# Patient Record
Sex: Male | Born: 1965 | Race: White | Hispanic: No | Smoking: Heavy tobacco smoker
Health system: Southern US, Community
[De-identification: ages and names within clinical notes are randomized; demographics above are authoritative.]

## PROBLEM LIST (undated history)

## (undated) DIAGNOSIS — K219 Gastro-esophageal reflux disease without esophagitis: Secondary | ICD-10-CM

## (undated) DIAGNOSIS — Z87828 Personal history of other (healed) physical injury and trauma: Secondary | ICD-10-CM

## (undated) DIAGNOSIS — I209 Angina pectoris, unspecified: Secondary | ICD-10-CM

## (undated) DIAGNOSIS — M5090 Cervical disc disorder, unspecified, unspecified cervical region: Secondary | ICD-10-CM

## (undated) HISTORY — PX: APPENDECTOMY: SHX54

---

## 2005-03-02 ENCOUNTER — Emergency Department: Payer: Self-pay | Admitting: Internal Medicine

## 2007-03-27 ENCOUNTER — Emergency Department: Payer: Self-pay | Admitting: Emergency Medicine

## 2007-03-29 ENCOUNTER — Emergency Department: Payer: Self-pay | Admitting: Emergency Medicine

## 2008-04-06 DIAGNOSIS — M25551 Pain in right hip: Secondary | ICD-10-CM | POA: Insufficient documentation

## 2008-05-19 DIAGNOSIS — M51369 Other intervertebral disc degeneration, lumbar region without mention of lumbar back pain or lower extremity pain: Secondary | ICD-10-CM | POA: Insufficient documentation

## 2008-05-19 DIAGNOSIS — M47819 Spondylosis without myelopathy or radiculopathy, site unspecified: Secondary | ICD-10-CM | POA: Insufficient documentation

## 2008-09-30 DIAGNOSIS — J322 Chronic ethmoidal sinusitis: Secondary | ICD-10-CM | POA: Insufficient documentation

## 2009-05-11 DIAGNOSIS — G894 Chronic pain syndrome: Secondary | ICD-10-CM | POA: Insufficient documentation

## 2011-10-23 ENCOUNTER — Emergency Department: Payer: Self-pay | Admitting: Emergency Medicine

## 2014-04-13 ENCOUNTER — Ambulatory Visit: Payer: Self-pay | Admitting: Pain Medicine

## 2014-04-27 ENCOUNTER — Ambulatory Visit: Payer: Self-pay | Admitting: Pain Medicine

## 2014-05-31 ENCOUNTER — Ambulatory Visit: Payer: Self-pay | Admitting: Pain Medicine

## 2014-06-06 ENCOUNTER — Ambulatory Visit: Payer: Self-pay | Admitting: Pain Medicine

## 2014-06-13 ENCOUNTER — Ambulatory Visit: Payer: Self-pay | Admitting: Pain Medicine

## 2014-06-14 ENCOUNTER — Emergency Department: Payer: Self-pay | Admitting: Emergency Medicine

## 2014-06-22 ENCOUNTER — Ambulatory Visit: Payer: Self-pay | Admitting: Pain Medicine

## 2014-07-19 ENCOUNTER — Ambulatory Visit: Payer: Self-pay | Admitting: Pain Medicine

## 2014-08-01 ENCOUNTER — Ambulatory Visit: Payer: Self-pay | Admitting: Pain Medicine

## 2014-08-18 ENCOUNTER — Ambulatory Visit: Payer: Self-pay | Admitting: Pain Medicine

## 2015-07-06 DIAGNOSIS — E7800 Pure hypercholesterolemia, unspecified: Secondary | ICD-10-CM | POA: Insufficient documentation

## 2016-01-22 ENCOUNTER — Emergency Department: Payer: Medicare Other

## 2016-01-22 ENCOUNTER — Emergency Department
Admission: EM | Admit: 2016-01-22 | Discharge: 2016-01-22 | Disposition: A | Discharge status: Home / Self Care | Payer: Medicare Other | Attending: Emergency Medicine | Admitting: Emergency Medicine

## 2016-01-22 ENCOUNTER — Encounter: Payer: Self-pay | Admitting: Emergency Medicine

## 2016-01-22 DIAGNOSIS — W25XXXA Contact with sharp glass, initial encounter: Secondary | ICD-10-CM | POA: Diagnosis not present

## 2016-01-22 DIAGNOSIS — S91341A Puncture wound with foreign body, right foot, initial encounter: Secondary | ICD-10-CM | POA: Insufficient documentation

## 2016-01-22 DIAGNOSIS — Y9389 Activity, other specified: Secondary | ICD-10-CM | POA: Insufficient documentation

## 2016-01-22 DIAGNOSIS — Y990 Civilian activity done for income or pay: Secondary | ICD-10-CM | POA: Diagnosis not present

## 2016-01-22 DIAGNOSIS — F17200 Nicotine dependence, unspecified, uncomplicated: Secondary | ICD-10-CM | POA: Insufficient documentation

## 2016-01-22 DIAGNOSIS — Y9289 Other specified places as the place of occurrence of the external cause: Secondary | ICD-10-CM | POA: Diagnosis not present

## 2016-01-22 DIAGNOSIS — S99921A Unspecified injury of right foot, initial encounter: Secondary | ICD-10-CM | POA: Diagnosis present

## 2016-01-22 DIAGNOSIS — S91331A Puncture wound without foreign body, right foot, initial encounter: Secondary | ICD-10-CM

## 2016-01-22 MED ORDER — LEVOFLOXACIN 500 MG PO TABS
750.0000 mg | ORAL_TABLET | Freq: Once | ORAL | Status: AC
  Administered 2016-01-22: 750 mg via ORAL
  Filled 2016-01-22: qty 1

## 2016-01-22 MED ORDER — LEVOFLOXACIN 750 MG PO TABS: 750.0000 mg | ORAL_TABLET | Freq: Every day | ORAL | Status: AC

## 2016-01-22 MED ORDER — HYDROCODONE-ACETAMINOPHEN 5-325 MG PO TABS: 1.0000 | ORAL_TABLET | Freq: Four times a day (QID) | ORAL | Status: DC | PRN

## 2016-01-22 MED ORDER — OXYCODONE-ACETAMINOPHEN 5-325 MG PO TABS
ORAL_TABLET | ORAL | Status: AC
  Administered 2016-01-22: 1 via ORAL
  Filled 2016-01-22: qty 1

## 2016-01-22 MED ORDER — OXYCODONE-ACETAMINOPHEN 5-325 MG PO TABS
1.0000 | ORAL_TABLET | Freq: Once | ORAL | Status: AC
  Administered 2016-01-22: 1 via ORAL

## 2016-01-22 MED ORDER — AMOXICILLIN-POT CLAVULANATE 875-125 MG PO TABS
1.0000 | ORAL_TABLET | Freq: Once | ORAL | Status: AC
  Administered 2016-01-22: 1 via ORAL

## 2016-01-22 MED ORDER — BACITRACIN ZINC 500 UNIT/GM EX OINT
TOPICAL_OINTMENT | Freq: Two times a day (BID) | CUTANEOUS | Status: DC
  Administered 2016-01-22: 1 via TOPICAL
  Filled 2016-01-22: qty 0.9

## 2016-01-22 MED ORDER — AMOXICILLIN-POT CLAVULANATE 875-125 MG PO TABS
ORAL_TABLET | ORAL | Status: AC
  Administered 2016-01-22: 1 via ORAL
  Filled 2016-01-22: qty 1

## 2016-01-22 MED ORDER — LIDOCAINE HCL (PF) 1 % IJ SOLN
5.0000 mL | Freq: Once | INTRAMUSCULAR | Status: AC
  Administered 2016-01-22: 5 mL
  Filled 2016-01-22: qty 5

## 2016-01-22 NOTE — ED Notes (Signed)
Pt reports he stepped on a broken beer bottle and a piece of glass went through his shoe and into the left foot. On assessment pt has shoe on, glass seen sticking out of the bottom of shoe with blood present from impalement.

## 2016-01-22 NOTE — ED Notes (Signed)
Pt arrived to the ED for laceration to the right foot with a pice of glass. Pt is AOx4 in no apparent distress with a visible piece of glass impaled through the shoe, no visible bleeding at the time of triage.

## 2016-01-22 NOTE — Discharge Instructions (Signed)
Puncture Wound A puncture wound is an injury that is caused by a sharp, thin object that goes through (penetrates) your skin. Usually, a puncture wound does not leave a large opening in your skin, so it may not bleed a lot. However, when you get a puncture wound, dirt or other materials (foreign bodies) can be forced into your wound and break off inside. This increases the chance of infection, such as tetanus. CAUSES Puncture wounds are caused by any sharp, thin object that goes through your skin, such as:  Animal teeth, as with an animal bite.  Sharp, pointed objects, such as nails, splinters of glass, fishhooks, and needles. SYMPTOMS Symptoms of a puncture wound include:  Pain.  Bleeding.  Swelling.  Bruising.  Fluid leaking from the wound.  Numbness, tingling, or loss of function. DIAGNOSIS This condition is diagnosed with a medical history and physical exam. Your wound will be checked to see if it contains any foreign bodies. You may also have X-rays or other imaging tests. TREATMENT Treatment for a puncture wound depends on how serious the wound is. It also depends on whether the wound contains any foreign bodies. Treatment for all types of puncture wounds usually starts with:  Controlling the bleeding.  Washing out the wound with a germ-free (sterile) salt-water solution.  Checking the wound for foreign bodies. Treatment may also include:  Having the wound opened surgically to remove a foreign object.  Closing the wound with stitches (sutures) if it continues to bleed.  Covering the wound with antibiotic ointments and a bandage (dressing).  Receiving a tetanus shot.  Receiving a rabies vaccine. HOME CARE INSTRUCTIONS Medicines  Take or apply over-the-counter and prescription medicines only as told by your health care provider.  If you were prescribed an antibiotic, take or apply it as told by your health care provider. Do not stop using the antibiotic even if  your condition improves. Wound Care  There are many ways to close and cover a wound. For example, a wound can be covered with sutures, skin glue, or adhesive strips. Follow instructions from your health care provider about:  How to take care of your wound.  When and how you should change your dressing.  When you should remove your dressing.  Removing whatever was used to close your wound.  Keep the dressing dry as told by your health care provider. Do not take baths, swim, use a hot tub, or do anything that would put your wound underwater until your health care provider approves.  Clean the wound as told by your health care provider.  Do not scratch or pick at the wound.  Check your wound every day for signs of infection. Watch for:  Redness, swelling, or pain.  Fluid, blood, or pus. General Instructions  Raise (elevate) the injured area above the level of your heart while you are sitting or lying down.  If your puncture wound is in your foot, ask your health care provider if you need to avoid putting weight on your foot and for how long.  Keep all follow-up visits as told by your health care provider. This is important. SEEK MEDICAL CARE IF:  You received a tetanus shot and you have swelling, severe pain, redness, or bleeding at the injection site.  You have a fever.  Your sutures come out.  You notice a bad smell coming from your wound or your dressing.  You notice something coming out of your wound, such as wood or glass.  Your   pain is not controlled with medicine.  You have increased redness, swelling, or pain at the site of your wound.  You have fluid, blood, or pus coming from your wound.  You notice a change in the color of your skin near your wound.  You need to change the dressing frequently due to fluid, blood, or pus draining from your wound.  You develop a new rash.  You develop numbness around your wound. SEEK IMMEDIATE MEDICAL CARE IF:  You  develop severe swelling around your wound.  Your pain suddenly increases and is severe.  You develop painful skin lumps.  You have a red streak going away from your wound.  The wound is on your hand or foot and you cannot properly move a finger or toe.  The wound is on your hand or foot and you notice that your fingers or toes look pale or bluish.   This information is not intended to replace advice given to you by your health care provider. Make sure you discuss any questions you have with your health care provider.   Document Released: 05/01/2005 Document Revised: 04/12/2015 Document Reviewed: 09/14/2014 Elsevier Interactive Patient Education [employer]2016 [employer]   Keep the wound clean, dry, and covered. Rest with the foot elevated when seated. Take the antibiotic as directed until completely gone. Return to the ED immediately for signs of worsening or spreading infection.

## 2016-01-23 NOTE — ED Provider Notes (Signed)
[facility] Emergency Department Provider Note ____________________________________________  Time seen: 2121  I have reviewed the triage vital signs and the nursing notes.  HISTORY  Chief Complaint  Foot Injury  HPI Andrew Rios is a 50 y.o. male presents to the ED for evaluation of a piece of glass that he stepped on about 20 minutes prior to arrival. 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 The glass pushed through the bottom of his shoe and into the bottom of his right foot. He presents with a current tetanus status and denies any head injury at this time. He's been unable to remove nor has he tried to remove the shoe prior to arrival.  History reviewed. No pertinent past medical history.  There are no active problems to display for this patient.   Past Surgical History  Procedure Laterality Date  . Appendectomy      Current Outpatient Rx  Name  Route  Sig  Dispense  Refill  . HYDROcodone-acetaminophen (NORCO) 5-325 MG tablet   Oral   Take 1 tablet by mouth every 6 (six) hours as needed for moderate pain.   12 tablet   0   . levofloxacin (LEVAQUIN) 750 MG tablet   Oral   Take 1 tablet (750 mg total) by mouth daily.   14 tablet   0    Allergies Review of patient's allergies indicates no known allergies.  History reviewed. No pertinent family history.  Social History Social History  Substance Use Topics  . Smoking status: Heavy Tobacco Smoker  . Smokeless tobacco: None  . Alcohol Use: No   Review of Systems  Constitutional: Negative for fever. Respiratory: Negative for shortness of breath. Musculoskeletal: Negative for back pain. Right foot retained foreign body. Skin: Negative for rash. Neurological: Negative for headaches, focal weakness or numbness. ____________________________________________  PHYSICAL EXAM:  VITAL SIGNS: ED  Triage Vitals  Enc Vitals Group     BP 01/22/16 2100 132/91 mmHg     Pulse Rate 01/22/16 2100 84     Resp 01/22/16 2100 18     Temp 01/22/16 2100 98.1 F (36.7 C)     Temp Source 01/22/16 2100 Oral     SpO2 01/22/16 2100 96 %     Weight 01/22/16 2100 165 lb (74.844 kg)     Height 01/22/16 2100 5\' 9"  (1.753 m)     Head Cir --      Peak Flow --      Pain Score 01/22/16 2102 5     Pain Loc --      Pain Edu? --      Excl. in GC? --    Constitutional: Alert and oriented. Well appearing and in no distress. Head: Normocephalic and atraumatic. Cardiovascular: Normal rate, regular rhythm.  Respiratory: Normal respiratory effort. No wheezes/rales/rhonchi. Gastrointestinal: Soft and nontender. No distention. Musculoskeletal: Patient with normal range of motion of the toes on the right foot following exposure of the injury. Nontender with normal range of motion in all extremities.  Neurologic:  Normal gait without ataxia. Normal speech and language. No gross focal neurologic deficits are appreciated. Skin:  Patient with a large: Glass foreign body impaled through the rubber soled shoe and into the plantar surface of his right foot. The shoe is in place and attempts to manipulate the foreign body caused discomfort. The patient's leather loafer is cut across  the dorsal surface allowing the shoe to be removed without difficulty. The large glass foreign body remains impaled through the shoe. No evidence of retained foreign body is appreciated grossly. A 1-1/2 cm linear laceration just below the third MTP remains. Further exploration is warranted. _______________________________   RADIOLOGY Right Foot IMPRESSION: 1. Curvilinear foreign body in the plantar soft tissues between second and third toes. 2. No acute bone abnormality is evident.  I, Jadira Nierman, Charlesetta Ivory, personally viewed and evaluated these images (plain radiographs) as part of my medical decision making, as well as reviewing the  written report by the radiologist. ____________________________________________  PROCEDURES  Augmentin 875 mg PO Percocet 5-325 mg PO Levaquin 750 mg PO  PUNCTURE WOUND EXPLORATION Performed by: Lissa Hoard Consent: Verbal consent obtained. Risks and benefits: risks, benefits and alternatives were discussed Type: abscess  Body area: Plantar surface right foot  Anesthesia: local infiltration  Local anesthetic: lidocaine 1% w/o epinephrine  Anesthetic total: 4 ml  Complexity: complex Explored using sterile forceps   Foreign Body(ies) Identified/Removed: none  Wound Cleansing: iodine + saline  Patient tolerance: Patient tolerated the procedure well with no immediate complications. ____________________________________________  INITIAL IMPRESSION / ASSESSMENT AND PLAN / ED COURSE  Patient with a puncture wound to the right plantar foot status post removal. The wound is anesthetized and explored and no additional foreign bodies are revealed. The patient is treated empirically with Levaquin and Augmentin in the ED. He'll be discharged with a prescription for Levaquin stenosis as directed. He is advised to monitor the foot wound closely for signs of an infection. He should rest the foot elevated and return to the ED immediately for any signs of infection.He is advised to return to ED in 3 days for wound check. ____________________________________________  FINAL CLINICAL IMPRESSION(S) / ED DIAGNOSES  Final diagnoses:  Puncture wound of foot, right, initial encounter     Lissa Hoard, PA-C 01/23/16 0136  Loleta Rose, MD 01/23/16 4782

## 2016-06-04 ENCOUNTER — Ambulatory Visit
Admission: EM | Admit: 2016-06-04 | Discharge: 2016-06-04 | Disposition: A | Discharge status: Home / Self Care | Payer: Medicare Other | Attending: Family Medicine | Admitting: Family Medicine

## 2016-06-04 DIAGNOSIS — J019 Acute sinusitis, unspecified: Secondary | ICD-10-CM | POA: Diagnosis not present

## 2016-06-04 DIAGNOSIS — J309 Allergic rhinitis, unspecified: Secondary | ICD-10-CM | POA: Diagnosis not present

## 2016-06-04 DIAGNOSIS — H1013 Acute atopic conjunctivitis, bilateral: Secondary | ICD-10-CM | POA: Diagnosis not present

## 2016-06-04 HISTORY — DX: Personal history of other (healed) physical injury and trauma: Z87.828

## 2016-06-04 MED ORDER — KETOTIFEN FUMARATE 0.025 % OP SOLN: 1.0000 [drp] | Freq: Two times a day (BID) | OPHTHALMIC | 0 refills | Status: DC

## 2016-06-04 MED ORDER — KETOROLAC TROMETHAMINE 0.5 % OP SOLN: 1.0000 [drp] | Freq: Four times a day (QID) | OPHTHALMIC | 0 refills | Status: DC

## 2016-06-04 MED ORDER — AMOXICILLIN-POT CLAVULANATE 875-125 MG PO TABS: 1.0000 | ORAL_TABLET | Freq: Two times a day (BID) | ORAL | 0 refills | Status: DC

## 2016-06-04 MED ORDER — CETIRIZINE-PSEUDOEPHEDRINE ER 5-120 MG PO TB12: 1.0000 | ORAL_TABLET | Freq: Two times a day (BID) | ORAL | 0 refills | Status: DC

## 2016-06-04 MED ORDER — FLUTICASONE PROPIONATE 50 MCG/ACT NA SUSP: 1.0000 | Freq: Two times a day (BID) | NASAL | 0 refills | Status: DC

## 2016-06-04 NOTE — ED Triage Notes (Signed)
Pt is suffering from sinus infection, and has a lot of pressure and coughing for about 2 weeks.

## 2016-06-04 NOTE — ED Provider Notes (Signed)
CSN: [id number]     Arrival date & time 06/04/16  1416 History   First MD Initiated Contact with Patient 06/04/16 1536     Chief Complaint  Patient presents with  . Recurrent Sinusitis   (Consider location/radiation/quality/duration/timing/severity/associated sxs/prior Treatment) HPI Patient is a 50 y.o. Male presents to UC c/o sinus pressure, cough, and bilateral eye itching and swelling, worse in left eye.  Has had sinus infection x 3 weeks with yellow drainage, sinus pressure, and cough. Cough mildly productive of yellow sputum. He has been taking Nyquil and says that the symptoms started to improve for a couple days but seem to have come back although his cough is improved. Reports having pneumonia 4x in the last 5 years. States that it is usually when things start to clear up in his head that he gets diagnosed with pneumonia.  He also reports waking up on Sunday with his left eyelid glued shut. He states that both eyes are itchy and watery although left feels more swollen. Denies sick contacts. Smokes 1/2 ppd x 35 years. Denies chills, fever, abdominal pain, N/V/D.  Past Medical History:  Diagnosis Date  . History of back injury    Past Surgical History:  Procedure Laterality Date  . APPENDECTOMY     Family History  Problem Relation Age of Onset  . Hypertension Mother   . Diabetes Father    Social History  Substance Use Topics  . Smoking status: Heavy Tobacco Smoker  . Smokeless tobacco: Never Used  . Alcohol use No    Review of Systems  Constitutional: Negative for chills, fatigue and fever.  HENT: Positive for congestion and ear discharge. Negative for ear pain, postnasal drip and sore throat.   Eyes: Positive for discharge (Clear, both eyes), itching (bilateral) and visual disturbance (Mild blurriness in left eye). Negative for photophobia.  Respiratory: Positive for cough (Mild, improving). Negative for chest tightness, shortness of breath and wheezing.   Cardiovascular:  Negative for chest pain.  Gastrointestinal: Positive for diarrhea (Intermittent last 3 weeks). Negative for abdominal pain, constipation, nausea and vomiting.  Neurological: Negative for dizziness and headaches.    Allergies  Review of patient's allergies indicates no known allergies.  Home Medications   Prior to Admission medications   Medication Sig Start Date End Date Taking? Authorizing Provider  HYDROcodone-acetaminophen (NORCO) 5-325 MG tablet Take 1 tablet by mouth every 6 (six) hours as needed for moderate pain. 01/22/16  Yes Jenise V Bacon Menshew, PA-C  amoxicillin-clavulanate (AUGMENTIN) 875-125 MG tablet Take 1 tablet by mouth every 12 (twelve) hours. 06/04/16   Lutricia FeilWilliam P Roemer, PA-C  cetirizine-pseudoephedrine (ZYRTEC-D) 5-120 MG tablet Take 1 tablet by mouth 2 (two) times daily. 06/04/16   Lutricia FeilWilliam P Roemer, PA-C  fluticasone (FLONASE) 50 MCG/ACT nasal spray Place 1 spray into both nostrils 2 (two) times daily. 06/04/16   Lutricia FeilWilliam P Roemer, PA-C  ketorolac (ACULAR) 0.5 % ophthalmic solution Place 1 drop into both eyes 4 (four) times daily. 06/04/16   Lutricia FeilWilliam P Roemer, PA-C  ketotifen (ALAWAY) 0.025 % ophthalmic solution Place 1 drop into both eyes 2 (two) times daily. 06/04/16   Lutricia FeilWilliam P Roemer, PA-C   Meds Ordered and Administered this Visit  Medications - No data to display  BP 113/88 (BP Location: Left Arm)   Pulse 79   Temp 98.6 F (37 C) (Oral)   Resp 18   Ht 5\' 7"  (1.702 m)   Wt 155 lb (70.3 kg)   SpO2 98%   BMI  24.28 kg/m  No data found.   Physical Exam  Constitutional: He is oriented to person, place, and time. He appears well-developed and well-nourished.  HENT:  Head: Normocephalic.  Right Ear: Hearing, tympanic membrane, external ear and ear canal normal.  Left Ear: Hearing, tympanic membrane, external ear and ear canal normal.  Nose: Right sinus exhibits no maxillary sinus tenderness and no frontal sinus tenderness. Left sinus exhibits no maxillary  sinus tenderness and no frontal sinus tenderness.  Mouth/Throat: Uvula is midline, oropharynx is clear and moist and mucous membranes are normal. Tonsils are 1+ on the right. Tonsils are 1+ on the left. No tonsillar exudate.  Eyes: EOM are normal. Pupils are equal, round, and reactive to light. Lids are everted and swept, no foreign bodies found. Right eye exhibits discharge (Clear discharge). Left eye exhibits discharge (clear, with swelling of upper lid ). Right conjunctiva is injected (mild). Left conjunctiva is injected (mild).  Neck: Normal range of motion. Neck supple.  Cardiovascular: Normal rate and regular rhythm.   Pulmonary/Chest: Effort normal and breath sounds normal. No respiratory distress.  Neurological: He is alert and oriented to person, place, and time.  Skin: Skin is warm and dry.  Psychiatric: He has a normal mood and affect. His behavior is normal.  Nursing note and vitals reviewed.   Urgent Care Course   Clinical Course    Procedures (including critical care time)  Labs Review Labs Reviewed - No data to display  Imaging Review No results found.   Visual Acuity Review  Right Eye Distance: 20/40 Left Eye Distance: 20/50 Bilateral Distance: 20/40   MDM   1. Acute non-recurrent sinusitis, unspecified location   2. Allergic conjunctivitis of both eyes and rhinitis    Discharge Medication List as of 06/04/2016  4:12 PM    START taking these medications   Details  amoxicillin-clavulanate (AUGMENTIN) 875-125 MG tablet Take 1 tablet by mouth every 12 (twelve) hours., Starting Tue 06/04/2016, Normal    cetirizine-pseudoephedrine (ZYRTEC-D) 5-120 MG tablet Take 1 tablet by mouth 2 (two) times daily., Starting Tue 06/04/2016, Normal    fluticasone (FLONASE) 50 MCG/ACT nasal spray Place 1 spray into both nostrils 2 (two) times daily., Starting Tue 06/04/2016, Normal    ketorolac (ACULAR) 0.5 % ophthalmic solution Place 1 drop into both eyes 4 (four) times  daily., Starting Tue 06/04/2016, Normal    ketotifen (ALAWAY) 0.025 % ophthalmic solution Place 1 drop into both eyes 2 (two) times daily., Starting Tue 06/04/2016, Normal        Plan: 1. Test/x-ray results and diagnosis reviewed with patient 2. rx as per orders; risks, benefits, potential side effects reviewed with patient.  Advised to switch to Zyrtec without the pseudoephedrine in a couple of weeks. 3. Recommend supportive treatment with plenty of fluids, tea with honey and lemon, cold compresses on the eyes, rest. 4. F/u prn if symptoms worsen or don't improve     Lutricia FeilWilliam P Roemer, PA-C 06/04/16 1659

## 2017-04-23 DIAGNOSIS — E786 Lipoprotein deficiency: Secondary | ICD-10-CM | POA: Insufficient documentation

## 2017-06-04 ENCOUNTER — Emergency Department: Payer: Medicare Other

## 2017-06-04 ENCOUNTER — Emergency Department
Admission: EM | Admit: 2017-06-04 | Discharge: 2017-06-04 | Disposition: A | Discharge status: Home / Self Care | Payer: Medicare Other | Attending: Emergency Medicine | Admitting: Emergency Medicine

## 2017-06-04 ENCOUNTER — Encounter: Payer: Self-pay | Admitting: Emergency Medicine

## 2017-06-04 DIAGNOSIS — F17200 Nicotine dependence, unspecified, uncomplicated: Secondary | ICD-10-CM | POA: Insufficient documentation

## 2017-06-04 DIAGNOSIS — M25572 Pain in left ankle and joints of left foot: Secondary | ICD-10-CM | POA: Diagnosis not present

## 2017-06-04 DIAGNOSIS — Z79899 Other long term (current) drug therapy: Secondary | ICD-10-CM | POA: Diagnosis not present

## 2017-06-04 DIAGNOSIS — M79605 Pain in left leg: Secondary | ICD-10-CM | POA: Diagnosis present

## 2017-06-04 LAB — COMPREHENSIVE METABOLIC PANEL
ALBUMIN: 4 g/dL (ref 3.5–5.0)
ALT: 19 U/L (ref 17–63)
ANION GAP: 10 (ref 5–15)
AST: 21 U/L (ref 15–41)
Alkaline Phosphatase: 92 U/L (ref 38–126)
BILIRUBIN TOTAL: 0.6 mg/dL (ref 0.3–1.2)
BUN: 7 mg/dL (ref 6–20)
CO2: 28 mmol/L (ref 22–32)
Calcium: 9.1 mg/dL (ref 8.9–10.3)
Chloride: 101 mmol/L (ref 101–111)
Creatinine, Ser: 1.02 mg/dL (ref 0.61–1.24)
GFR calc Af Amer: >60 mL/min (ref >60)
GFR calc non Af Amer: >60 mL/min (ref >60)
GLUCOSE: 110 mg/dL — AB (ref 65–99)
POTASSIUM: 4.6 mmol/L (ref 3.5–5.1)
SODIUM: 139 mmol/L (ref 135–145)
TOTAL PROTEIN: 7 g/dL (ref 6.5–8.1)

## 2017-06-04 LAB — CBC
HCT: 40.8 % (ref 40.0–52.0)
Hemoglobin: 14.2 g/dL (ref 13.0–18.0)
MCH: 34.4 pg — ABNORMAL HIGH (ref 26.0–34.0)
MCHC: 34.8 g/dL (ref 32.0–36.0)
MCV: 99 fL (ref 80.0–100.0)
Platelets: 259 10*3/uL (ref 150–440)
RBC: 4.12 MIL/uL — ABNORMAL LOW (ref 4.40–5.90)
RDW: 14.5 % (ref 11.5–14.5)
WBC: 9.4 10*3/uL (ref 3.8–10.6)

## 2017-06-04 MED ORDER — HYDROCODONE-ACETAMINOPHEN 5-325 MG PO TABS: 1.0000 | ORAL_TABLET | ORAL | 0 refills | Status: DC | PRN

## 2017-06-04 MED ORDER — INDOMETHACIN 50 MG PO CAPS: 50.0000 mg | ORAL_CAPSULE | Freq: Two times a day (BID) | ORAL | 0 refills | Status: DC

## 2017-06-04 MED ORDER — HYDROCODONE-ACETAMINOPHEN 5-325 MG PO TABS
2.0000 | ORAL_TABLET | Freq: Once | ORAL | Status: AC
  Administered 2017-06-04: 2 via ORAL
  Filled 2017-06-04: qty 2

## 2017-06-04 NOTE — ED Notes (Signed)
Spoke with Dr. Williams in regards to patient presentation. See orders. 

## 2017-06-04 NOTE — ED Notes (Addendum)
Pt stating that he has been having problems with his left foot since last Thursday night. Pt stating that he does not recall any injuries done. Pt stating decreased sensitivity. Pt stating pain increases with walking. Pt stating pain in primarily in left medial ankle. Minimal edema noted. Pt stating that pain has actually decreased since Monday, but that it is still present.

## 2017-06-04 NOTE — ED Triage Notes (Signed)
Patient presents to ED via POV from home with c/o left ankle pain that radiates up to the back of his calf. Calf is red and edematous. Patient denies SOB or CP. Denies any injury or trauma. Ankle is edematous as well.

## 2017-06-04 NOTE — ED Notes (Signed)
Pt's father was brought from the waiting room.

## 2017-06-04 NOTE — ED Provider Notes (Signed)
[facility] Emergency Department Provider Note  Time seen: 5:00 PM  I have reviewed the triage vital signs and the nursing notes.   HISTORY  Chief Complaint Leg Pain    HPI Andrew Rios is a 51 y.o. male who presents to the emergency department with left ankle pain and swelling.  According to the patient approximately 1 week ago he awoke with left ankle pain and swelling.  He states he does not specifically recall an injury but states it is possible that he injured the ankle.  Denies any fever, chest pain, shortness of breath.  Patient states significant pain with attempted ambulation or any weightbearing of the left ankle.   Past Medical History:  Diagnosis Date  . History of back injury     There are no active problems to display for this patient.   Past Surgical History:  Procedure Laterality Date  . APPENDECTOMY      Prior to Admission medications   Medication Sig Start Date End Date Taking? Authorizing Provider  amoxicillin-clavulanate (AUGMENTIN) 875-125 MG tablet Take 1 tablet by mouth every 12 (twelve) hours. 06/04/16   Lutricia Feil, PA-C  cetirizine-pseudoephedrine (ZYRTEC-D) 5-120 MG tablet Take 1 tablet by mouth 2 (two) times daily. 06/04/16   Lutricia Feil, PA-C  fluticasone (FLONASE) 50 MCG/ACT nasal spray Place 1 spray into both nostrils 2 (two) times daily. 06/04/16   Lutricia Feil, PA-C  HYDROcodone-acetaminophen (NORCO) 5-325 MG tablet Take 1 tablet by mouth every 6 (six) hours as needed for moderate pain. 01/22/16   Menshew, Charlesetta Ivory, PA-C  ketorolac (ACULAR) 0.5 % ophthalmic solution Place 1 drop into both eyes 4 (four) times daily. 06/04/16   Lutricia Feil, PA-C  ketotifen (ALAWAY) 0.025 % ophthalmic solution Place 1 drop into both eyes 2 (two) times daily. 06/04/16   Lutricia Feil, PA-C    No Known Allergies  Family History  Problem Relation Age of Onset  . Hypertension Mother   . Diabetes  Father     Social History Social History  Substance Use Topics  . Smoking status: Heavy Tobacco Smoker  . Smokeless tobacco: Never Used  . Alcohol use No    Review of Systems Constitutional: Negative for fever Cardiovascular: Negative for chest pain. Respiratory: Negative for shortness of breath. Gastrointestinal: Negative for abdominal pain Musculoskeletal: Left ankle pain swelling Neurological: Negative for headache All other ROS negative  ____________________________________________   PHYSICAL EXAM:  VITAL SIGNS: ED Triage Vitals  Enc Vitals Group     BP 06/04/17 1546 135/87     Pulse Rate 06/04/17 1546 94     Resp 06/04/17 1546 15     Temp 06/04/17 1546 98.1 F (36.7 C)     Temp src --      SpO2 06/04/17 1546 99 %     Weight 06/04/17 1546 170 lb (77.1 kg)     Height 06/04/17 1546 5\' 8"  (1.727 m)     Head Circumference --      Peak Flow --      Pain Score 06/04/17 1638 7     Pain Loc --      Pain Edu? --      Excl. in GC? --     Constitutional: Alert and oriented. Well appearing and in no distress. Eyes: Normal exam ENT   Head: Normocephalic and atraumatic.   Mouth/Throat: Mucous membranes are moist. Cardiovascular: Normal rate, regular rhythm. No murmur Respiratory: Normal respiratory effort without tachypnea  nor retractions. Breath sounds are clear Gastrointestinal: Soft and nontender. No distention.   Musculoskeletal: Moderate left ankle tenderness to palpation with mild edema around the ankle.  No erythema, neurovascular intact distally.  No calf tenderness.  Achilles tendon intact on calf squeeze. Neurologic:  Normal speech and language. No gross focal neurologic deficits  Skin:  Skin is warm, dry and intact.  Psychiatric: Mood and affect are normal.     RADIOLOGY  Ultrasound negative  ____________________________________________   INITIAL IMPRESSION / ASSESSMENT AND PLAN / ED COURSE  Pertinent labs & imaging results that were  available during my care of the patient were reviewed by me and considered in my medical decision making (see chart for details).  Patient presents to the emergency department for left ankle pain and swelling, no known traumatic injury although the patient states it is possible.  Patient differential at this time would include traumatic injury, fracture, sprain, DVT, cellulitis.  Patient's exam is most consistent with possible traumatic injury or sprain.  He has swelling around the ankle most tender to the lateral malleolus.  No erythema to suggest cellulitis.  No fever.  Patient's labs are normal with a normal white blood cell count.  Moderate pain with any attempted range of motion of the ankle.  Ultrasound is negative for DVT.  Will obtain an x-ray to rule out fracture.  I reviewed the patient's records which are largely noncontributory to today's visit.  X-ray shows a joint effusion but no other traumatic findings.  As the patient's ultrasound is negative, x-ray is largely negative.  We will treat with compression with an Ace wrap, crutches with weightbearing as tolerated short course of pain medication and have the patient follow-up with orthopedics for further evaluation.  The patient is agreeable to this plan of care.  Again currently on exam there is no erythema, no fever normal white count, no signs of cellulitis.  Patient possibly with gouty arthritis although no history of the same.  We will also placed on NSAIDs for symptom relief. ____________________________________________   FINAL CLINICAL IMPRESSION(S) / ED DIAGNOSES  Ankle pain Ankle effusion   Minna AntisPaduchowski, Ismail Graziani, MD 06/04/17 1737

## 2017-06-11 DIAGNOSIS — I200 Unstable angina: Secondary | ICD-10-CM | POA: Diagnosis present

## 2017-06-12 ENCOUNTER — Encounter
Admission: RE | Disposition: A | Discharge status: Home / Self Care | Payer: Self-pay | Source: Ambulatory Visit | Attending: Internal Medicine

## 2017-06-12 ENCOUNTER — Ambulatory Visit
Admission: RE | Admit: 2017-06-12 | Discharge: 2017-06-12 | Disposition: A | Discharge status: Home / Self Care | Payer: Medicare Other | Source: Ambulatory Visit | Attending: Internal Medicine | Admitting: Internal Medicine

## 2017-06-12 ENCOUNTER — Encounter: Payer: Self-pay | Admitting: *Deleted

## 2017-06-12 DIAGNOSIS — Z7982 Long term (current) use of aspirin: Secondary | ICD-10-CM | POA: Insufficient documentation

## 2017-06-12 DIAGNOSIS — I10 Essential (primary) hypertension: Secondary | ICD-10-CM | POA: Diagnosis not present

## 2017-06-12 DIAGNOSIS — F17210 Nicotine dependence, cigarettes, uncomplicated: Secondary | ICD-10-CM | POA: Insufficient documentation

## 2017-06-12 DIAGNOSIS — R079 Chest pain, unspecified: Secondary | ICD-10-CM | POA: Diagnosis present

## 2017-06-12 DIAGNOSIS — I2510 Atherosclerotic heart disease of native coronary artery without angina pectoris: Secondary | ICD-10-CM | POA: Insufficient documentation

## 2017-06-12 DIAGNOSIS — Z8249 Family history of ischemic heart disease and other diseases of the circulatory system: Secondary | ICD-10-CM | POA: Insufficient documentation

## 2017-06-12 DIAGNOSIS — Z7951 Long term (current) use of inhaled steroids: Secondary | ICD-10-CM | POA: Diagnosis not present

## 2017-06-12 DIAGNOSIS — E782 Mixed hyperlipidemia: Secondary | ICD-10-CM | POA: Diagnosis not present

## 2017-06-12 DIAGNOSIS — I200 Unstable angina: Secondary | ICD-10-CM | POA: Diagnosis present

## 2017-06-12 HISTORY — DX: Cervical disc disorder, unspecified, unspecified cervical region: M50.90

## 2017-06-12 HISTORY — PX: LEFT HEART CATH AND CORONARY ANGIOGRAPHY: CATH118249

## 2017-06-12 HISTORY — DX: Gastro-esophageal reflux disease without esophagitis: K21.9

## 2017-06-12 HISTORY — DX: Angina pectoris, unspecified: I20.9

## 2017-06-12 SURGERY — LEFT HEART CATH AND CORONARY ANGIOGRAPHY
Anesthesia: Moderate Sedation

## 2017-06-12 MED ORDER — FENTANYL CITRATE (PF) 100 MCG/2ML IJ SOLN
INTRAMUSCULAR | Status: DC | PRN
  Administered 2017-06-12: 25 ug via INTRAVENOUS

## 2017-06-12 MED ORDER — SODIUM CHLORIDE 0.9 % WEIGHT BASED INFUSION
3.0000 mL/kg/h | INTRAVENOUS | Status: AC
  Administered 2017-06-12: 3 mL/kg/h via INTRAVENOUS

## 2017-06-12 MED ORDER — FENTANYL CITRATE (PF) 100 MCG/2ML IJ SOLN
INTRAMUSCULAR | Status: AC
  Filled 2017-06-12: qty 2

## 2017-06-12 MED ORDER — IOPAMIDOL (ISOVUE-300) INJECTION 61%
INTRAVENOUS | Status: DC | PRN
Start: 2017-06-12 — End: 2017-06-12
  Administered 2017-06-12: 120 mL via INTRAVENOUS

## 2017-06-12 MED ORDER — SODIUM CHLORIDE 0.9% FLUSH
3.0000 mL | INTRAVENOUS | Status: DC | PRN
  Administered 2017-06-12: 3 mL via INTRAVENOUS

## 2017-06-12 MED ORDER — ASPIRIN 81 MG PO CHEW: 81.0000 mg | CHEWABLE_TABLET | ORAL | Status: DC

## 2017-06-12 MED ORDER — SODIUM CHLORIDE 0.9% FLUSH: 3.0000 mL | Freq: Two times a day (BID) | INTRAVENOUS | Status: DC

## 2017-06-12 MED ORDER — LIDOCAINE HCL (PF) 1 % IJ SOLN
INTRAMUSCULAR | Status: AC
  Filled 2017-06-12: qty 30

## 2017-06-12 MED ORDER — MIDAZOLAM HCL 2 MG/2ML IJ SOLN
INTRAMUSCULAR | Status: AC
  Filled 2017-06-12: qty 2

## 2017-06-12 MED ORDER — SODIUM CHLORIDE 0.9 % WEIGHT BASED INFUSION: 1.0000 mL/kg/h | INTRAVENOUS | Status: DC

## 2017-06-12 MED ORDER — ACETAMINOPHEN 325 MG PO TABS: 650.0000 mg | ORAL_TABLET | ORAL | Status: DC | PRN

## 2017-06-12 MED ORDER — ONDANSETRON HCL 4 MG/2ML IJ SOLN: 4.0000 mg | Freq: Four times a day (QID) | INTRAMUSCULAR | Status: DC | PRN

## 2017-06-12 MED ORDER — HEPARIN (PORCINE) IN NACL 2-0.9 UNIT/ML-% IJ SOLN
INTRAMUSCULAR | Status: AC
  Filled 2017-06-12: qty 500

## 2017-06-12 MED ORDER — MIDAZOLAM HCL 2 MG/2ML IJ SOLN
INTRAMUSCULAR | Status: DC | PRN
Start: 2017-06-12 — End: 2017-06-12
  Administered 2017-06-12: 1 mg via INTRAVENOUS

## 2017-06-12 MED ORDER — SODIUM CHLORIDE 0.9 % IV SOLN: 250.0000 mL | INTRAVENOUS | Status: DC | PRN

## 2017-06-12 SURGICAL SUPPLY — 9 items
CATH INFINITI 5FR ANG PIGTAIL (CATHETERS) ×3 IMPLANT
CATH INFINITI 5FR JL4 (CATHETERS) ×3 IMPLANT
CATH INFINITI JR4 5F (CATHETERS) ×3 IMPLANT
DEVICE CLOSURE MYNXGRIP 5F (Vascular Products) ×3 IMPLANT
KIT MANI 3VAL PERCEP (MISCELLANEOUS) ×3 IMPLANT
NEEDLE PERC 18GX7CM (NEEDLE) ×3 IMPLANT
PACK CARDIAC CATH (CUSTOM PROCEDURE TRAY) ×3 IMPLANT
SHEATH PINNACLE 5F 10CM (SHEATH) ×3 IMPLANT
WIRE EMERALD 3MM-J .035X150CM (WIRE) ×3 IMPLANT

## 2017-07-01 DIAGNOSIS — I2510 Atherosclerotic heart disease of native coronary artery without angina pectoris: Secondary | ICD-10-CM | POA: Insufficient documentation

## 2017-07-03 DIAGNOSIS — I10 Essential (primary) hypertension: Secondary | ICD-10-CM | POA: Insufficient documentation

## 2018-04-28 ENCOUNTER — Other Ambulatory Visit: Payer: Self-pay | Admitting: Family Medicine

## 2018-04-28 DIAGNOSIS — F17200 Nicotine dependence, unspecified, uncomplicated: Secondary | ICD-10-CM

## 2018-04-28 DIAGNOSIS — Z8262 Family history of osteoporosis: Secondary | ICD-10-CM

## 2018-04-28 DIAGNOSIS — M899 Disorder of bone, unspecified: Secondary | ICD-10-CM

## 2018-05-26 ENCOUNTER — Encounter: Payer: Self-pay | Admitting: *Deleted

## 2018-12-03 DIAGNOSIS — T148XXA Other injury of unspecified body region, initial encounter: Secondary | ICD-10-CM | POA: Insufficient documentation

## 2018-12-14 IMAGING — DX DG ANKLE COMPLETE 3+V*L*
3 series · 3 of 3 positions shown · non-contrast
Comparison: None

CLINICAL DATA: Ankle and foot pain beginning 6 days ago. No known
injury.

EXAM:
LEFT ANKLE COMPLETE - 3+ VIEW

[ankle ap]
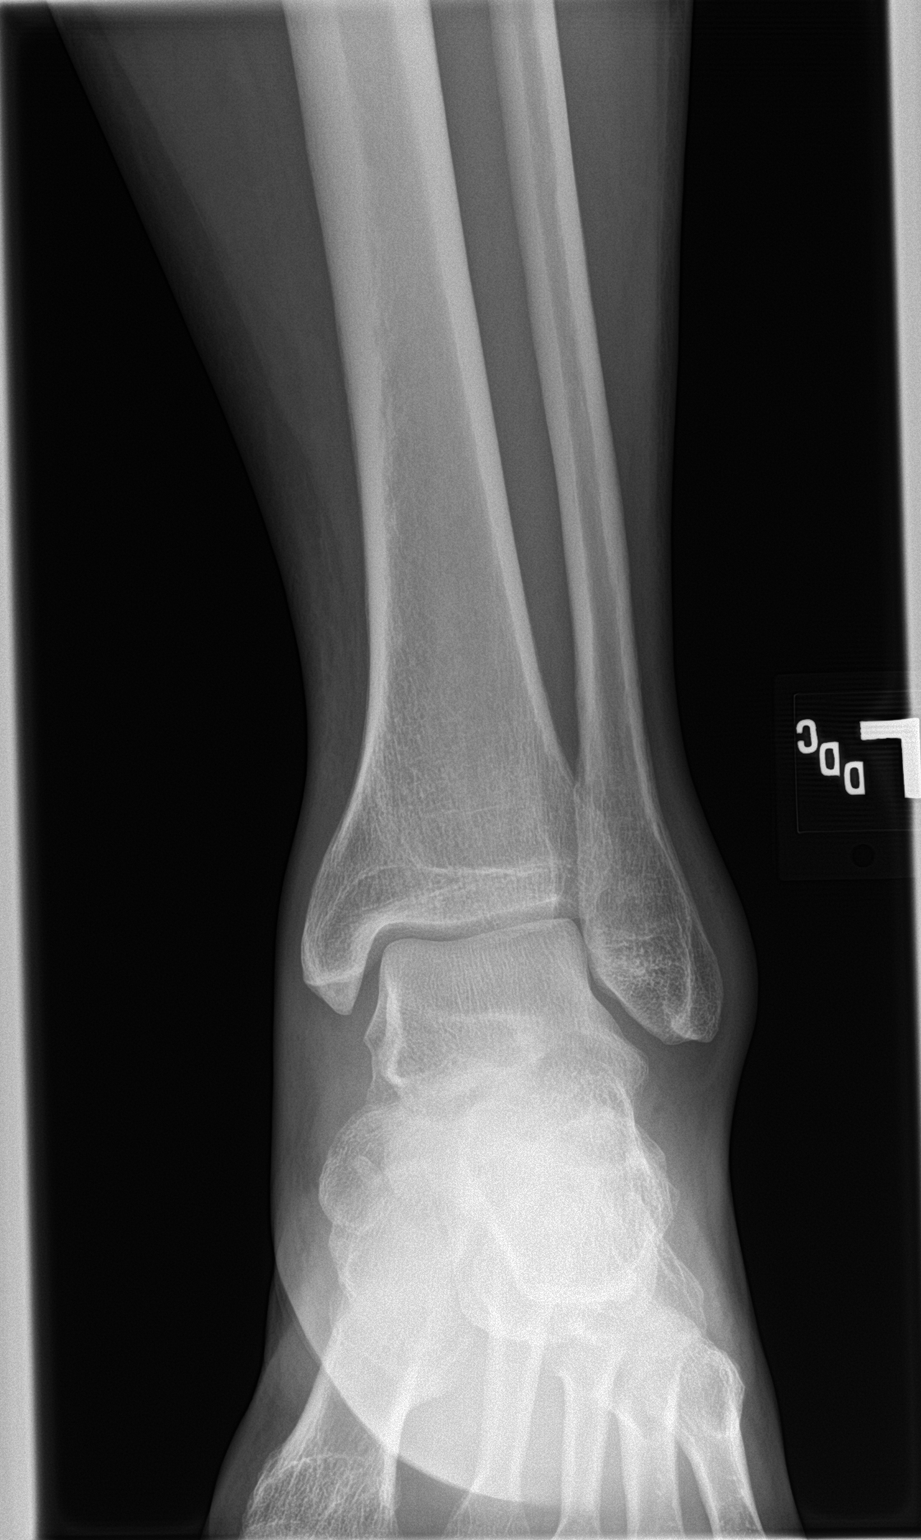

[ankle obl]
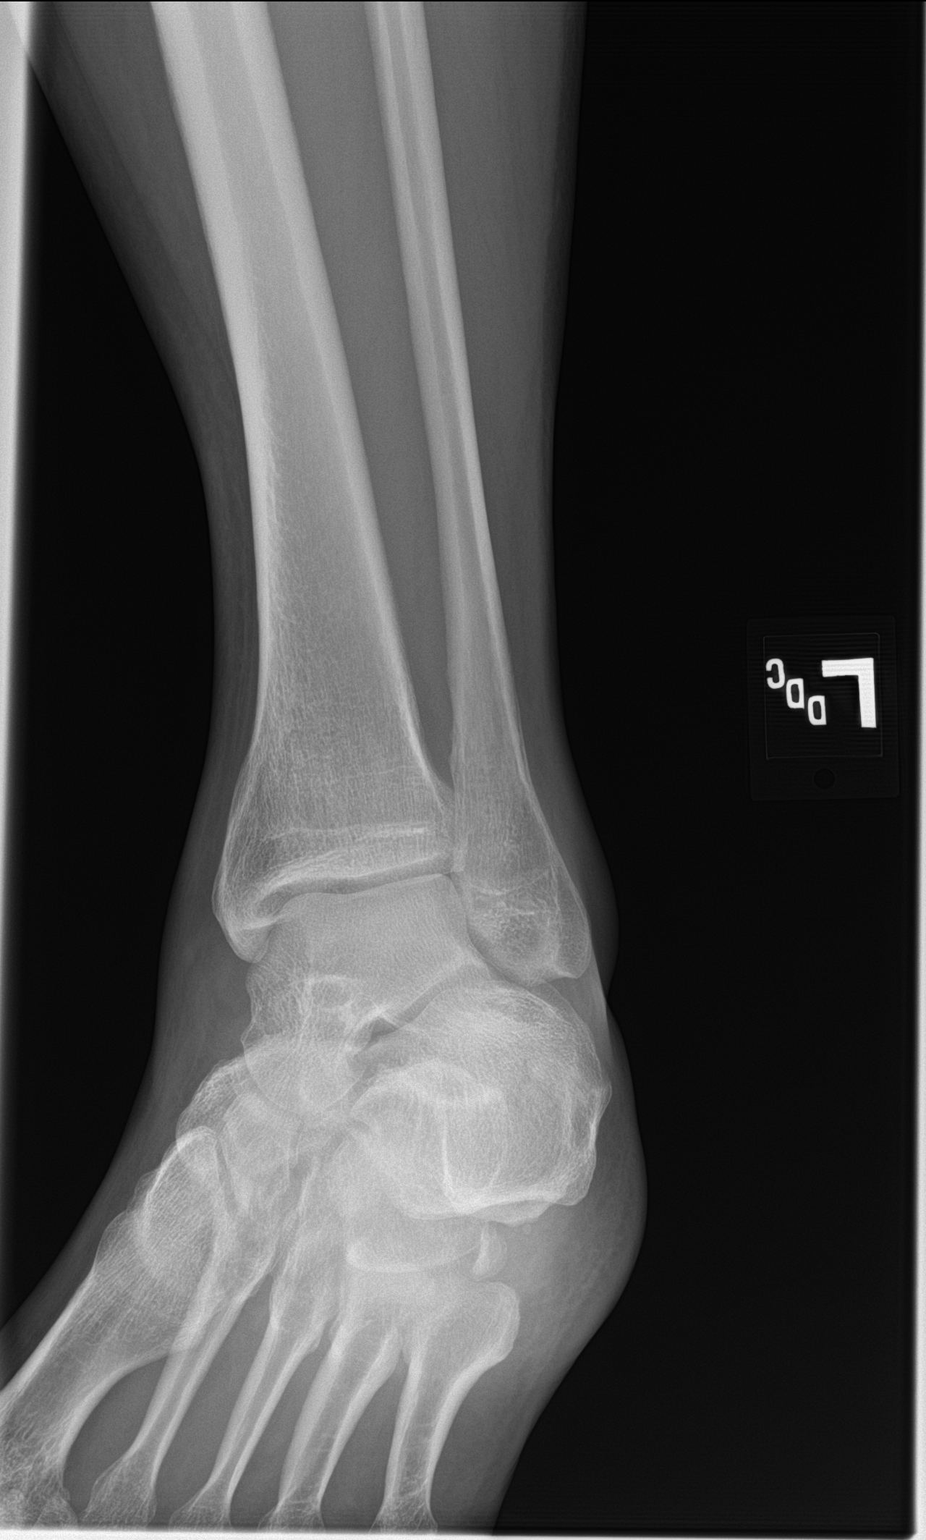

[ankle lat]
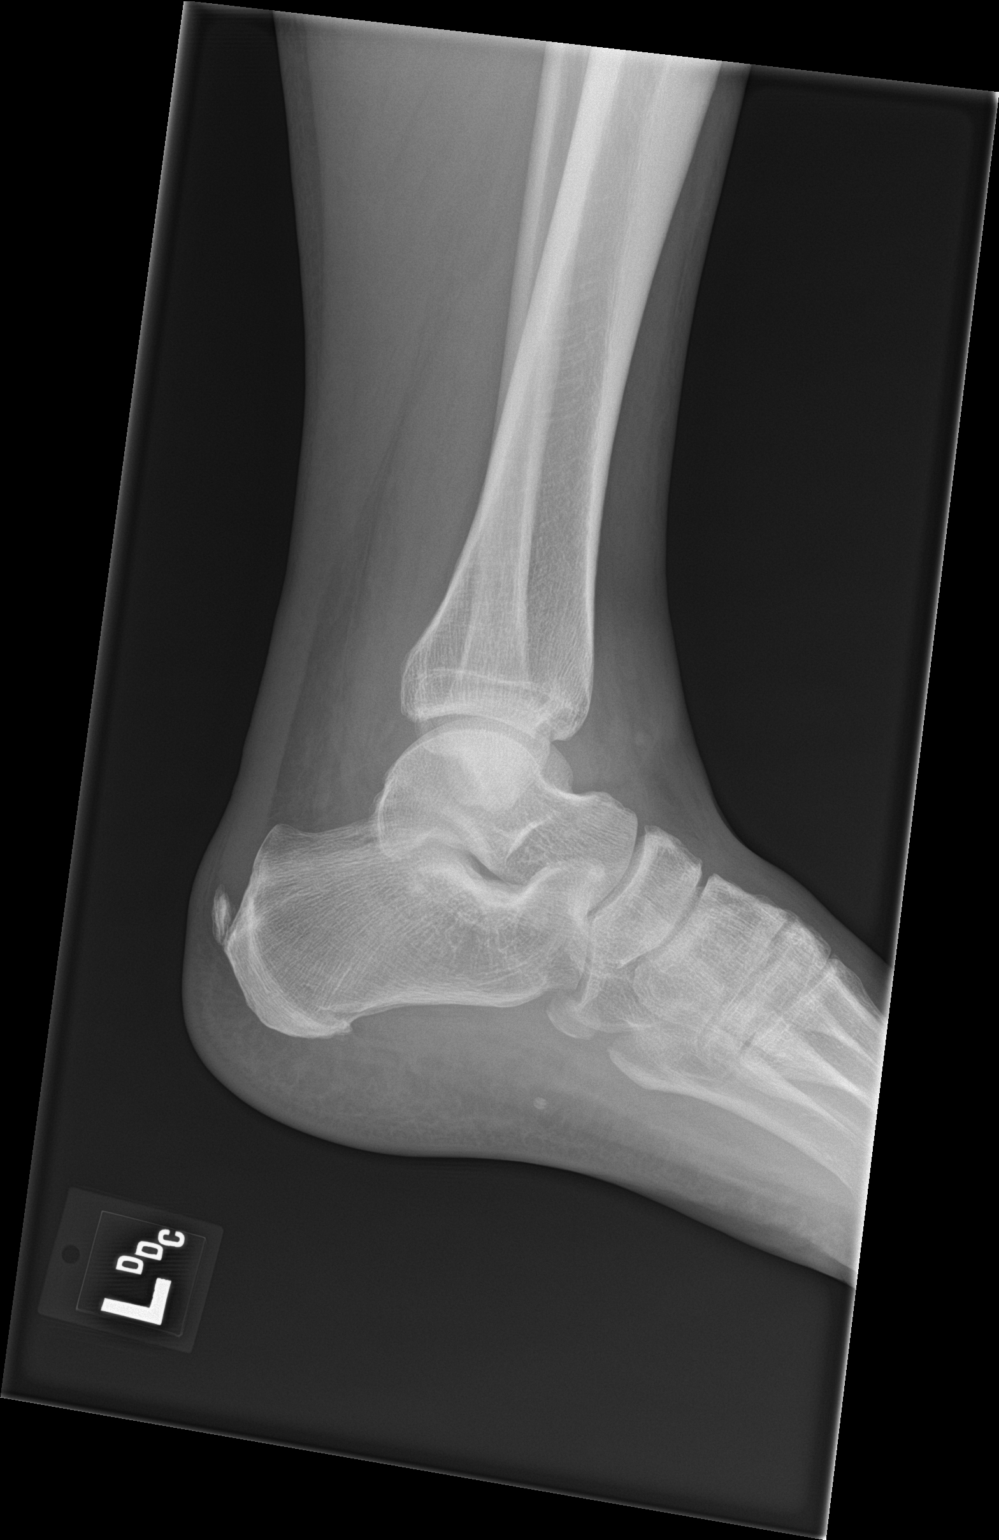

[3 of 3 positions shown; findings below may reference images not displayed]

FINDINGS: No evidence of fracture or dislocation. The patient has a joint
effusion. No bone erosions are seen. Mid fluid appears normal.
IMPRESSION: Ankle joint effusion. No traumatic finding. No specific arthritic
finding.

## 2019-03-12 DIAGNOSIS — I5030 Unspecified diastolic (congestive) heart failure: Secondary | ICD-10-CM | POA: Insufficient documentation

## 2019-03-12 DIAGNOSIS — M7989 Other specified soft tissue disorders: Secondary | ICD-10-CM | POA: Insufficient documentation

## 2019-03-29 ENCOUNTER — Encounter (INDEPENDENT_AMBULATORY_CARE_PROVIDER_SITE_OTHER): Payer: Medicaid Other | Admitting: Vascular Surgery

## 2019-04-09 ENCOUNTER — Encounter (INDEPENDENT_AMBULATORY_CARE_PROVIDER_SITE_OTHER): Payer: Self-pay

## 2019-04-09 ENCOUNTER — Encounter (INDEPENDENT_AMBULATORY_CARE_PROVIDER_SITE_OTHER): Payer: Self-pay | Admitting: Vascular Surgery

## 2019-04-09 ENCOUNTER — Other Ambulatory Visit: Payer: Self-pay

## 2019-04-09 ENCOUNTER — Ambulatory Visit (INDEPENDENT_AMBULATORY_CARE_PROVIDER_SITE_OTHER): Payer: Medicare Other | Admitting: Vascular Surgery

## 2019-04-09 VITALS — BP 111/67 | HR 66 | Resp 12 | Ht 67.5 in | Wt 159.0 lb

## 2019-04-09 DIAGNOSIS — M549 Dorsalgia, unspecified: Secondary | ICD-10-CM | POA: Insufficient documentation

## 2019-04-09 DIAGNOSIS — I10 Essential (primary) hypertension: Secondary | ICD-10-CM

## 2019-04-09 DIAGNOSIS — M7989 Other specified soft tissue disorders: Secondary | ICD-10-CM | POA: Diagnosis not present

## 2019-04-09 DIAGNOSIS — E7800 Pure hypercholesterolemia, unspecified: Secondary | ICD-10-CM

## 2019-04-09 DIAGNOSIS — F17200 Nicotine dependence, unspecified, uncomplicated: Secondary | ICD-10-CM | POA: Insufficient documentation

## 2019-04-09 NOTE — Progress Notes (Signed)
Patient ID: Andrew MechanicJeffrey Alan Gerke, male   DOB: 11/04/1965, 53 y.o.   MRN: [id number]  Chief Complaint  Patient presents with  . New Patient (Initial Visit)    HPI Andrew Rios is a 53 y.o. male.  I am asked to see the patient by Dr. Gavin PottersGrandis for evaluation of leg swelling.  This was most severe about a month ago but has been troublesome for many months now.  Is no clear inciting event or causative factor that started the symptoms.  When he is up on his feet more throughout the day and is more swollen at night.  He reports the right leg being a little worse than the left.  He said it started in his knees and ankles and he has significant arthritis in these locations.  He also has some back issues and is in a back brace today.  No open ulceration or infection.  No fever or chill.  Patient denies any antecedent history of DVT or superficial thrombophlebitis to his knowledge.  He reports elevating them and wearing compression stockings over the last few weeks which seem to have helped.     Past Medical History:  Diagnosis Date  . Anginal pain (HCC)   . Cervical back pain with evidence of disc disease   . GERD (gastroesophageal reflux disease)   . History of back injury     Past Surgical History:  Procedure Laterality Date  . APPENDECTOMY    . LEFT HEART CATH AND CORONARY ANGIOGRAPHY N/A 06/12/2017   Procedure: LEFT HEART CATH AND CORONARY ANGIOGRAPHY;  Surgeon: Lamar BlinksKowalski, Bruce J, MD;  Location: [facility];  Service: Cardiovascular;  Laterality: N/A;    Family History Family History  Problem Relation Age of Onset  . Hypertension Mother   . Diabetes Father   no bleeding disorders, clotting disorders, or aneurysms.  Social History Social History   Tobacco Use  . Smoking status: Heavy Tobacco Smoker    Packs/day: 1.00    Years: 35.00    Pack years: 35.00  . Smokeless tobacco: Never Used  Substance Use Topics  . Alcohol use: No  . Drug use: No     Allergies  Allergen Reactions  . Gabapentin Other (See Comments)    Made him nauseous and tried    Current Outpatient Medications  Medication Sig Dispense Refill  . aspirin EC 81 MG tablet Take 81 mg daily by mouth.    Marland Kitchen. atorvastatin (LIPITOR) 80 MG tablet TK 1/2 T PO ONCE D    . azelastine (OPTIVAR) 0.05 % ophthalmic solution Apply to eye.    . cyclobenzaprine (FLEXERIL) 10 MG tablet cyclobenzaprine 10 mg tablet  TK 1 T PO TID    . ezetimibe (ZETIA) 10 MG tablet     . fluticasone (FLONASE) 50 MCG/ACT nasal spray Place 1 spray into both nostrils 2 (two) times daily. 16 g 0  . furosemide (LASIX) 20 MG tablet     . methocarbamol (ROBAXIN) 750 MG tablet methocarbamol 750 mg tablet    . metoprolol succinate (TOPROL-XL) 25 MG 24 hr tablet Take 12.5 mg daily by mouth.    . nitroGLYCERIN (NITROSTAT) 0.4 MG SL tablet Place under the tongue.    Marland Kitchen. tiZANidine (ZANAFLEX) 2 MG tablet TK 1 T PO QHS PRN     No current facility-administered medications for this visit.       REVIEW OF SYSTEMS (Negative unless checked)  Constitutional: [] Weight loss  [] Fever  [] Chills Cardiac: [] Chest pain   []   Chest pressure   [] Palpitations   [] Shortness of breath when laying flat   [] Shortness of breath at rest   [] Shortness of breath with exertion. Vascular:  [x] Pain in legs with walking   [x] Pain in legs at rest   [] Pain in legs when laying flat   [] Claudication   [] Pain in feet when walking  [] Pain in feet at rest  [] Pain in feet when laying flat   [] History of DVT   [] Phlebitis   [x] Swelling in legs   [] Varicose veins   [] Non-healing ulcers Pulmonary:   [] Uses home oxygen   [] Productive cough   [] Hemoptysis   [] Wheeze  [] COPD   [] Asthma Neurologic:  [] Dizziness  [] Blackouts   [] Seizures   [] History of stroke   [] History of TIA  [] Aphasia   [] Temporary blindness   [] Dysphagia   [] Weakness or numbness in arms   [] Weakness or numbness in legs Musculoskeletal:  [x] Arthritis   [] Joint swelling   [x] Joint pain    [x] Low back pain Hematologic:  [] Easy bruising  [] Easy bleeding   [] Hypercoagulable state   [] Anemic  [] Hepatitis Gastrointestinal:  [] Blood in stool   [] Vomiting blood  [x] Gastroesophageal reflux/heartburn   [] Abdominal pain Genitourinary:  [] Chronic kidney disease   [] Difficult urination  [] Frequent urination  [] Burning with urination   [] Hematuria Skin:  [] Rashes   [] Ulcers   [] Wounds Psychological:  [] History of anxiety   []  History of major depression.    Physical Exam BP 111/67 (BP Location: Left Arm, Patient Position: Sitting, Cuff Size: Normal)   Pulse 66   Resp 12   Ht 5' 7.5" (1.715 m)   Wt 159 lb (72.1 kg)   BMI 24.54 kg/m  Gen:  WD/WN, NAD Head: [place]/AT, No temporalis wasting.  Ear/Nose/Throat: Hearing grossly intact, nares w/o erythema or drainage, oropharynx w/o Erythema/Exudate Eyes: Conjunctiva clear, sclera non-icteric  Neck: trachea midline.  No JVD.  Pulmonary:  Good air movement, respirations not labored, no use of accessory muscles  Cardiac: RRR, no JVD Vascular:  Vessel Right Left  Radial Palpable Palpable                                   Gastrointestinal:. No masses, surgical incisions, or scars. Musculoskeletal: M/S 5/5 throughout.  Extremities without ischemic changes.  No deformity or atrophy. 1+ BLE edema, a little worse on the right than the left. Neurologic: Sensation grossly intact in extremities.  Symmetrical.  Speech is fluent. Motor exam as listed above. Psychiatric: Judgment intact, Mood & affect appropriate for pt's clinical situation. Dermatologic: No rashes or ulcers noted.  No cellulitis or open wounds.    Radiology No results found.  Labs No results found for this or any previous visit (from the past 2160 hour(s)).  Assessment/Plan:  Benign essential HTN blood pressure control important in reducing the progression of atherosclerotic disease. On appropriate oral medications.   Hypercholesterolemia lipid control important in  reducing the progression of atherosclerotic disease. Continue statin therapy   Leg swelling I have had a long discussion with the patient regarding swelling and why it  causes symptoms.  Patient will continue wearing graduated compression stockings class 1 (20-30 mmHg) on a daily basis. The patient will  beginning wearing the stockings first thing in the morning and removing them in the evening. The patient is instructed specifically not to sleep in the stockings.   In addition, behavioral modification will be initiated.  This will include frequent  elevation, use of over the counter pain medications and exercise such as walking.  I have reviewed systemic causes for chronic edema such as liver, kidney and cardiac etiologies.  The patient denies problems with these organ systems.    Consideration for a lymph pump will also be made based upon the effectiveness of conservative therapy.  This would help to improve the edema control and prevent sequela such as ulcers and infections   Patient should undergo duplex ultrasound of the venous system to ensure that DVT or reflux is not present.  The patient will follow-up with me after the ultrasound.        Festus Barren 04/09/2019, 12:51 PM   This note was created with Dragon medical transcription system.  Any errors from dictation are unintentional.

## 2019-04-09 NOTE — Assessment & Plan Note (Signed)
I have had a long discussion with the patient regarding swelling and why it  causes symptoms.  Patient will continue wearing graduated compression stockings class 1 (20-30 mmHg) on a daily basis. The patient will  beginning wearing the stockings first thing in the morning and removing them in the evening. The patient is instructed specifically not to sleep in the stockings.   In addition, behavioral modification will be initiated.  This will include frequent elevation, use of over the counter pain medications and exercise such as walking.  I have reviewed systemic causes for chronic edema such as liver, kidney and cardiac etiologies.  The patient denies problems with these organ systems.    Consideration for a lymph pump will also be made based upon the effectiveness of conservative therapy.  This would help to improve the edema control and prevent sequela such as ulcers and infections   Patient should undergo duplex ultrasound of the venous system to ensure that DVT or reflux is not present.  The patient will follow-up with me after the ultrasound.   

## 2019-04-09 NOTE — Assessment & Plan Note (Signed)
blood pressure control important in reducing the progression of atherosclerotic disease. On appropriate oral medications.  

## 2019-04-09 NOTE — Patient Instructions (Signed)

## 2019-04-09 NOTE — Assessment & Plan Note (Signed)
lipid control important in reducing the progression of atherosclerotic disease. Continue statin therapy  

## 2019-04-14 ENCOUNTER — Encounter (INDEPENDENT_AMBULATORY_CARE_PROVIDER_SITE_OTHER): Payer: Self-pay | Admitting: Nurse Practitioner

## 2019-04-14 ENCOUNTER — Ambulatory Visit (INDEPENDENT_AMBULATORY_CARE_PROVIDER_SITE_OTHER): Payer: Medicare Other | Admitting: Nurse Practitioner

## 2019-04-14 ENCOUNTER — Ambulatory Visit (INDEPENDENT_AMBULATORY_CARE_PROVIDER_SITE_OTHER): Payer: Medicare Other

## 2019-04-14 ENCOUNTER — Other Ambulatory Visit: Payer: Self-pay

## 2019-04-14 VITALS — BP 107/67 | HR 78 | Resp 10 | Ht 67.5 in | Wt 162.0 lb

## 2019-04-14 DIAGNOSIS — M7989 Other specified soft tissue disorders: Secondary | ICD-10-CM | POA: Diagnosis not present

## 2019-04-14 DIAGNOSIS — E7800 Pure hypercholesterolemia, unspecified: Secondary | ICD-10-CM

## 2019-04-14 DIAGNOSIS — I890 Lymphedema, not elsewhere classified: Secondary | ICD-10-CM

## 2019-04-14 DIAGNOSIS — F17200 Nicotine dependence, unspecified, uncomplicated: Secondary | ICD-10-CM | POA: Diagnosis not present

## 2019-04-14 DIAGNOSIS — I10 Essential (primary) hypertension: Secondary | ICD-10-CM

## 2019-04-14 NOTE — Progress Notes (Signed)
SUBJECTIVE:  Patient ID: Andrew Rios, male    DOB: 10/08/1965, 53 y.o.   MRN: [id number] Chief Complaint  Patient presents with  . Follow-up    HPI  Andrew Rios is a 53 y.o. male that presents today for follow-up studies with concerns for lower extremity edema.  The patient has a history of heart failure with preserved ejection fraction.  The patient states his leg swelling has been troublesome for many months now.  He states that it is been mostly about a month or so ago.  The patient states that the swelling is worse after he has been up on his feet all day, generally standing in 1 place.  He also states that sometimes if he sits for too long and points the swelling will get worse.  He describes this as a painful sensation.  The patient has no history of DVT that he is aware of.  The patient denies any ulcerations or thrombophlebitis.  The patient does have a history of back issues and tends to wear back brace daily.  The patient was recommended by his primary care physician to begin wearing medical grade 1 compression stockings on a daily basis and he does note that the compression stockings help however it does not completely limit the swelling.  Elevation helps as well.  The patient underwent a bilateral lower extremity venous reflux study which notes reflux within the common femoral vein of the right lower extremity.  There is also reflux noted at the saphenofemoral junction bilaterally.  No evidence of DVT or superficial venous thrombosis bilaterally.  Past Medical History:  Diagnosis Date  . Anginal pain (HCC)   . Cervical back pain with evidence of disc disease   . GERD (gastroesophageal reflux disease)   . History of back injury     Past Surgical History:  Procedure Laterality Date  . APPENDECTOMY    . LEFT HEART CATH AND CORONARY ANGIOGRAPHY N/A 06/12/2017   Procedure: LEFT HEART CATH AND CORONARY ANGIOGRAPHY;  Surgeon: Lamar BlinksKowalski, Bruce J, MD;  Location:  [facility];  Service: Cardiovascular;  Laterality: N/A;    Social History   Socioeconomic History  . Marital status: Widowed    Spouse name: Not on file  . Number of children: Not on file  . Years of education: Not on file  . Highest education level: Not on file  Occupational History  . Not on file  Social Needs  . Financial resource strain: Not on file  . Food insecurity    Worry: Not on file    Inability: Not on file  . Transportation needs    Medical: Not on file    Non-medical: Not on file  Tobacco Use  . Smoking status: Heavy Tobacco Smoker    Packs/day: 1.00    Years: 35.00    Pack years: 35.00  . Smokeless tobacco: Never Used  Substance and Sexual Activity  . Alcohol use: No  . Drug use: No  . Sexual activity: Yes    Birth control/protection: Condom  Lifestyle  . Physical activity    Days per week: Not on file    Minutes per session: Not on file  . Stress: Not on file  Relationships  . Social Musicianconnections    Talks on phone: Not on file    Gets together: Not on file    Attends religious service: Not on file    Active member of club or organization: Not on file  Attends meetings of clubs or organizations: Not on file    Relationship status: Not on file  . Intimate partner violence    Fear of current or ex partner: Not on file    Emotionally abused: Not on file    Physically abused: Not on file    Forced sexual activity: Not on file  Other Topics Concern  . Not on file  Social History Narrative  . Not on file    Family History  Problem Relation Age of Onset  . Hypertension Mother   . Diabetes Father     Allergies  Allergen Reactions  . Gabapentin Other (See Comments)    Made him nauseous and tried     Review of Systems   Review of Systems: Negative Unless Checked Constitutional: [] Weight loss  [] Fever  [] Chills Cardiac: [] Chest pain   []  Atrial Fibrillation  [] Palpitations   [] Shortness of breath when laying flat   [] Shortness of  breath with exertion. [] Shortness of breath at rest Vascular:  [] Pain in legs with walking   [] Pain in legs with standing [] Pain in legs when laying flat   [] Claudication    [] Pain in feet when laying flat    [] History of DVT   [] Phlebitis   [x] Swelling in legs   [] Varicose veins   [] Non-healing ulcers Pulmonary:   [] Uses home oxygen   [] Productive cough   [] Hemoptysis   [] Wheeze  [] COPD   [] Asthma Neurologic:  [] Dizziness   [] Seizures  [] Blackouts [] History of stroke   [] History of TIA  [] Aphasia   [] Temporary Blindness   [] Weakness or numbness in arm   [] Weakness or numbness in leg Musculoskeletal:   [] Joint swelling   [] Joint pain   [x] Low back pain  []  History of Knee Replacement [] Arthritis [] back Surgeries  []  Spinal Stenosis    Hematologic:  [] Easy bruising  [] Easy bleeding   [] Hypercoagulable state   [] Anemic Gastrointestinal:  [] Diarrhea   [] Vomiting  [] Gastroesophageal reflux/heartburn   [] Difficulty swallowing. [] Abdominal pain Genitourinary:  [] Chronic kidney disease   [] Difficult urination  [] Anuric   [] Blood in urine [] Frequent urination  [] Burning with urination   [] Hematuria Skin:  [] Rashes   [] Ulcers [] Wounds Psychological:  [] History of anxiety   []  History of major depression  []  Memory Difficulties      OBJECTIVE:   Physical Exam  BP 107/67 (BP Location: Left Arm, Patient Position: Sitting, Cuff Size: Normal)   Pulse 78   Resp 10   Ht 5' 7.5" (1.715 m)   Wt 162 lb (73.5 kg)   BMI 25.00 kg/m   Gen: WD/WN, NAD Head: [place]/AT, No temporalis wasting.  Ear/Nose/Throat: Hearing grossly intact, nares w/o erythema or drainage Eyes: PER, EOMI, sclera nonicteric.  Neck: Supple, no masses.  No JVD.  Pulmonary:  Good air movement, no use of accessory muscles.  Cardiac: RRR Vascular:  2+ lower extremity edema bilaterally Vessel Right Left  Radial Palpable Palpable  Dorsalis Pedis Palpable Palpable  Posterior Tibial Palpable Palpable   Gastrointestinal: soft, non-distended. No  guarding/no peritoneal signs.  Musculoskeletal: M/S 5/5 throughout.  No deformity or atrophy.  Neurologic: Pain and light touch intact in extremities.  Symmetrical.  Speech is fluent. Motor exam as listed above. Psychiatric: Judgment intact, Mood & affect appropriate for pt's clinical situation. Dermatologic:  Bilateral stasis dermatitis no Ulcers Noted.  No changes consistent with cellulitis. Lymph : No Cervical lymphadenopathy, dermal thickening bilaterally       ASSESSMENT AND PLAN:  1. Lymphedema Recommend:  No surgery  or intervention at this point in time.    I have reviewed my previous discussion with the patient regarding swelling and why it causes symptoms.  Patient will continue wearing graduated compression stockings class 1 (20-30 mmHg) on a daily basis. The patient will  beginning wearing the stockings first thing in the morning and removing them in the evening. The patient is instructed specifically not to sleep in the stockings.    In addition, behavioral modification including several periods of elevation of the lower extremities during the day will be continued.  This was reviewed with the patient during the initial visit.  The patient will also continue routine exercise, especially walking on a daily basis as was discussed during the initial visit.    Despite conservative treatments including graduated compression therapy class 1 and behavioral modification including exercise and elevation the patient  has not obtained adequate control of the lymphedema.  The patient still has stage 3 lymphedema and therefore, I believe that a lymph pump should be added to improve the control of the patient's lymphedema.  Additionally, a lymph pump is warranted because it will reduce the risk of cellulitis and ulceration in the future.  Patient should follow-up in six months    2. Smoker Smoking cessation was discussed, 3-10 minutes spent on this topic specifically   3. Benign  essential HTN Continue antihypertensive medications as already ordered, these medications have been reviewed and there are no changes at this time.   4. Hypercholesterolemia Continue statin as ordered and reviewed, no changes at this time    Current Outpatient Medications on File Prior to Visit  Medication Sig Dispense Refill  . aspirin EC 81 MG tablet Take 81 mg daily by mouth.    Marland Kitchen atorvastatin (LIPITOR) 80 MG tablet TK 1/2 T PO ONCE D    . azelastine (OPTIVAR) 0.05 % ophthalmic solution Apply to eye.    . Buprenorphine HCl-Naloxone HCl 8-2 MG FILM     . cyclobenzaprine (FLEXERIL) 10 MG tablet cyclobenzaprine 10 mg tablet  TK 1 T PO TID    . ezetimibe (ZETIA) 10 MG tablet     . fluticasone (FLONASE) 50 MCG/ACT nasal spray Place 1 spray into both nostrils 2 (two) times daily. 16 g 0  . furosemide (LASIX) 20 MG tablet     . methocarbamol (ROBAXIN) 750 MG tablet methocarbamol 750 mg tablet    . metoprolol succinate (TOPROL-XL) 25 MG 24 hr tablet Take 12.5 mg daily by mouth.    . nitroGLYCERIN (NITROSTAT) 0.4 MG SL tablet Place under the tongue.    Marland Kitchen tiZANidine (ZANAFLEX) 2 MG tablet TK 1 T PO QHS PRN     No current facility-administered medications on file prior to visit.     There are no Patient Instructions on file for this visit. Return in about 6 months (around 10/12/2019).   Georgiana Spinner, NP  This note was completed with Office manager.  Any errors are purely unintentional.

## 2019-04-16 ENCOUNTER — Other Ambulatory Visit: Payer: Self-pay | Admitting: Adult Health

## 2019-04-16 ENCOUNTER — Other Ambulatory Visit
Admission: RE | Admit: 2019-04-16 | Discharge: 2019-04-16 | Disposition: A | Discharge status: Home / Self Care | Payer: Medicare Other | Source: Ambulatory Visit | Attending: Adult Health | Admitting: Adult Health

## 2019-04-16 ENCOUNTER — Ambulatory Visit
Admission: RE | Admit: 2019-04-16 | Discharge: 2019-04-16 | Disposition: A | Discharge status: Home / Self Care | Payer: Medicare Other | Source: Ambulatory Visit | Attending: Adult Health | Admitting: Adult Health

## 2019-04-16 DIAGNOSIS — M25562 Pain in left knee: Secondary | ICD-10-CM | POA: Insufficient documentation

## 2019-04-16 DIAGNOSIS — M25561 Pain in right knee: Secondary | ICD-10-CM

## 2019-04-16 DIAGNOSIS — M5440 Lumbago with sciatica, unspecified side: Secondary | ICD-10-CM | POA: Insufficient documentation

## 2019-04-16 LAB — HEPATIC FUNCTION PANEL
ALT: 8 U/L (ref 0–44)
AST: 13 U/L — ABNORMAL LOW (ref 15–41)
Albumin: 3.5 g/dL (ref 3.5–5.0)
Alkaline Phosphatase: 87 U/L (ref 38–126)
Bilirubin, Direct: 0.1 mg/dL (ref 0.0–0.2)
Indirect Bilirubin: 0.4 mg/dL (ref 0.3–0.9)
Total Bilirubin: 0.5 mg/dL (ref 0.3–1.2)
Total Protein: 6.8 g/dL (ref 6.5–8.1)

## 2019-08-07 ENCOUNTER — Emergency Department
Admission: EM | Admit: 2019-08-07 | Discharge: 2019-08-07 | Disposition: A | Discharge status: Home / Self Care | Payer: Medicare Other | Attending: Emergency Medicine | Admitting: Emergency Medicine

## 2019-08-07 ENCOUNTER — Encounter: Payer: Self-pay | Admitting: Emergency Medicine

## 2019-08-07 ENCOUNTER — Other Ambulatory Visit: Payer: Self-pay

## 2019-08-07 DIAGNOSIS — K047 Periapical abscess without sinus: Secondary | ICD-10-CM | POA: Insufficient documentation

## 2019-08-07 DIAGNOSIS — I2510 Atherosclerotic heart disease of native coronary artery without angina pectoris: Secondary | ICD-10-CM | POA: Diagnosis not present

## 2019-08-07 DIAGNOSIS — Z7982 Long term (current) use of aspirin: Secondary | ICD-10-CM | POA: Diagnosis not present

## 2019-08-07 DIAGNOSIS — Z79899 Other long term (current) drug therapy: Secondary | ICD-10-CM | POA: Diagnosis not present

## 2019-08-07 DIAGNOSIS — I10 Essential (primary) hypertension: Secondary | ICD-10-CM | POA: Insufficient documentation

## 2019-08-07 DIAGNOSIS — K0889 Other specified disorders of teeth and supporting structures: Secondary | ICD-10-CM | POA: Diagnosis present

## 2019-08-07 DIAGNOSIS — F17210 Nicotine dependence, cigarettes, uncomplicated: Secondary | ICD-10-CM | POA: Diagnosis not present

## 2019-08-07 MED ORDER — LIDOCAINE VISCOUS HCL 2 % MT SOLN: 5.0000 mL | Freq: Four times a day (QID) | OROMUCOSAL | 0 refills | Status: DC | PRN

## 2019-08-07 MED ORDER — IBUPROFEN 600 MG PO TABS
600.0000 mg | ORAL_TABLET | Freq: Once | ORAL | Status: AC
  Administered 2019-08-07: 600 mg via ORAL
  Filled 2019-08-07: qty 1

## 2019-08-07 MED ORDER — LIDOCAINE VISCOUS HCL 2 % MT SOLN
15.0000 mL | Freq: Once | OROMUCOSAL | Status: AC
  Administered 2019-08-07: 14:00:00 15 mL via OROMUCOSAL
  Filled 2019-08-07: qty 15

## 2019-08-07 MED ORDER — IBUPROFEN 600 MG PO TABS: 600.0000 mg | ORAL_TABLET | Freq: Three times a day (TID) | ORAL | 0 refills | Status: DC | PRN

## 2019-08-07 MED ORDER — AMOXICILLIN 500 MG PO CAPS: 500.0000 mg | ORAL_CAPSULE | Freq: Three times a day (TID) | ORAL | 0 refills | Status: DC

## 2019-08-07 NOTE — Discharge Instructions (Addendum)
Follow discharge care instruction take medication as directed. Follow-up for list of dental clinics provided in your discharge care instructions.

## 2019-08-07 NOTE — ED Notes (Signed)
Pt states dental pain for 2 months on left side

## 2019-08-07 NOTE — ED Triage Notes (Signed)
L upper dental pain for 2 months.

## 2019-08-07 NOTE — ED Provider Notes (Signed)
[facility] Emergency Department Provider Note   ____________________________________________   First MD Initiated Contact with Patient 08/07/19 1312     (approximate)  I have reviewed the triage vital signs and the nursing notes.   HISTORY  Chief Complaint Dental Pain    HPI Lang Andrew Rios is a 54 y.o. male patient presents with left upper dental pain for 2 months.  Patient state in the past 3 to 4 days increased swelling and pain.  Patient denies fever associated complaint.  Patient has a history of devitalized teeth and no dental care for many years.  Patient rates his pain a 6/10.  Patient described as "achy".  No palliative measures for complaint.  Patient has history of narcotic use and is currently on Buprepnorp-Nalox.         Past Medical History:  Diagnosis Date  . Anginal pain ([place])   . Cervical back pain with evidence of disc disease   . GERD (gastroesophageal reflux disease)   . History of back injury     Patient Active Problem List   Diagnosis Date Noted  . Back pain 04/09/2019  . Smoker 04/09/2019  . (HFpEF) heart failure with preserved ejection fraction ([place]) 03/12/2019  . Leg swelling 03/12/2019  . Bruising 12/03/2018  . Benign essential HTN 07/03/2017  . Coronary artery disease involving native coronary artery of native heart without angina pectoris 07/01/2017  . Unstable angina pectoris ([place]) 06/11/2017  . Low HDL (under 40) 04/23/2017  . Hypercholesterolemia 07/06/2015    Past Surgical History:  Procedure Laterality Date  . APPENDECTOMY    . LEFT HEART CATH AND CORONARY ANGIOGRAPHY N/A 06/12/2017   Procedure: LEFT HEART CATH AND CORONARY ANGIOGRAPHY;  Surgeon: Corey Skains, MD;  Location: [facility];  Service: Cardiovascular;  Laterality: N/A;    Prior to Admission medications   Medication Sig Start Date End Date Taking? Authorizing Provider  amoxicillin (AMOXIL) 500 MG capsule Take 1 capsule  (500 mg total) by mouth 3 (three) times daily. 08/07/19   Sable Feil, PA-C  aspirin EC 81 MG tablet Take 81 mg daily by mouth.    [provider]  atorvastatin (LIPITOR) 80 MG tablet TK 1/2 T PO ONCE D 03/16/19   [provider]  Buprenorphine HCl-Naloxone HCl 8-2 MG FILM  04/13/19   [provider]  cyclobenzaprine (FLEXERIL) 10 MG tablet cyclobenzaprine 10 mg tablet  TK 1 T PO TID    [provider]  ezetimibe (ZETIA) 10 MG tablet  03/10/19   [provider]  fluticasone (FLONASE) 50 MCG/ACT nasal spray Place 1 spray into both nostrils 2 (two) times daily. 06/04/16   Lorin Picket, PA-C  furosemide (LASIX) 20 MG tablet  02/11/19   [provider]  ibuprofen (ADVIL) 600 MG tablet Take 1 tablet (600 mg total) by mouth every 8 (eight) hours as needed. 08/07/19   Sable Feil, PA-C  lidocaine (XYLOCAINE) 2 % solution Use as directed 5 mLs in the mouth or throat every 6 (six) hours as needed for mouth pain. Oral swish for 30 seconds. 08/07/19   Sable Feil, PA-C  methocarbamol (ROBAXIN) 750 MG tablet methocarbamol 750 mg tablet 07/09/17   [provider]  metoprolol succinate (TOPROL-XL) 25 MG 24 hr tablet Take 12.5 mg daily by mouth.    [provider]  nitroGLYCERIN (NITROSTAT) 0.4 MG SL tablet Place under the tongue. 06/10/17   [provider]  tiZANidine (ZANAFLEX) 2  MG tablet TK 1 T PO QHS PRN 04/06/19   [provider]    Allergies Gabapentin  Family History  Problem Relation Age of Onset  . Hypertension Mother   . Diabetes Father     Social History Social History   Tobacco Use  . Smoking status: Heavy Tobacco Smoker    Packs/day: 1.00    Years: 35.00    Pack years: 35.00  . Smokeless tobacco: Never Used  Substance Use Topics  . Alcohol use: No  . Drug use: No    Review of Systems  Constitutional: No fever/chills Eyes: No visual changes. ENT: No sore throat.  Dental  pain. Cardiovascular: Denies chest pain. Respiratory: Denies shortness of breath. Gastrointestinal: No abdominal pain.  No nausea, no vomiting.  No diarrhea.  No constipation. Genitourinary: Negative for dysuria. Musculoskeletal: Chronic back pain. Skin: Negative for rash. Neurological: Negative for headaches, focal weakness or numbness. Endocrine:  Lipidemia Allergic/Immunilogical: Gabapentin ____________________________________________   PHYSICAL EXAM:  VITAL SIGNS: ED Triage Vitals  Enc Vitals Group     BP 08/07/19 1201 133/77     Pulse Rate 08/07/19 1201 90     Resp 08/07/19 1201 20     Temp 08/07/19 1201 98.5 F (36.9 C)     Temp Source 08/07/19 1201 Oral     SpO2 08/07/19 1201 98 %     Weight 08/07/19 1203 165 lb (74.8 kg)     Height 08/07/19 1203 5' 7.5" (1.715 m)     Head Circumference --      Peak Flow --      Pain Score 08/07/19 1202 6     Pain Loc --      Pain Edu? --      Excl. in GC? --     Constitutional: Alert and oriented. Well appearing and in no acute distress. Eyes: Conjunctivae are normal. PERRL. EOMI. Head: Atraumatic. Nose: No congestion/rhinnorhea. Mouth/Throat: Mucous membranes are moist.  Oropharynx non-erythematous.  Multiple caries and devitalized teeth of the left upper molar. Hematological/Lymphatic/Immunilogical: No cervical lymphadenopathy. Cardiovascular: Normal rate, regular rhythm. Grossly normal heart sounds.  Good peripheral circulation. Respiratory: Normal respiratory effort.  No retractions. Lungs CTAB. Gastrointestinal: Soft and nontender. No distention. No abdominal Skin:  Skin is warm, dry and intact. No rash noted. Psychiatric: Mood and affect are normal. Speech and behavior are normal.  ____________________________________________   LABS (all labs ordered are listed, but only abnormal results are displayed)  Labs Reviewed - No data to  display ____________________________________________  EKG   ____________________________________________  RADIOLOGY  ED MD interpretation:    Official radiology report(s): No results found.  ____________________________________________   PROCEDURES  Procedure(s) performed (including Critical Care):  Procedures   ____________________________________________   INITIAL IMPRESSION / ASSESSMENT AND PLAN / ED COURSE  As part of my medical decision making, I reviewed the following data within the electronic MEDICAL RECORD NUMBER     Patient presents with 2 months of increasing dental pain and gingiva edema.  Physical exam consistent with dental abscess.  Area is nonfluctuant.  Patient given discharge care instruction advised take medication as directed.  Patient advised to follow-up for list of dental clinics provided.    Dandra Velardi was evaluated in Emergency Department on 08/07/2019 for the symptoms described in the history of present illness. He was evaluated in the context of the global COVID-19 pandemic, which necessitated consideration that the patient might be at risk for infection with the SARS-CoV-2 virus that causes COVID-19. Institutional protocols  and algorithms that pertain to the evaluation of patients at risk for COVID-19 are in a state of rapid change based on information released by regulatory bodies including the CDC and federal and state organizations. These policies and algorithms were followed during the patient's care in the ED.       ____________________________________________   FINAL CLINICAL IMPRESSION(S) / ED DIAGNOSES  Final diagnoses:  Dental abscess     ED Discharge Orders         Ordered    amoxicillin (AMOXIL) 500 MG capsule  3 times daily     08/07/19 1359    ibuprofen (ADVIL) 600 MG tablet  Every 8 hours PRN     08/07/19 1359    lidocaine (XYLOCAINE) 2 % solution  Every 6 hours PRN     08/07/19 1359           Note:  This  document was prepared using Dragon voice recognition software and may include unintentional dictation errors.    Joni Reining, PA-C 08/07/19 1406    Emily Filbert, MD 08/07/19 [phone]

## 2019-10-13 ENCOUNTER — Ambulatory Visit (INDEPENDENT_AMBULATORY_CARE_PROVIDER_SITE_OTHER): Payer: Medicare Other | Admitting: Nurse Practitioner

## 2020-10-25 IMAGING — CR DG KNEE COMPLETE 4+V*R*
4 series · 4 of 4 positions shown · non-contrast
Comparison: None

CLINICAL DATA: Right knee pain, no known injury, initial encounter

EXAM:
RIGHT KNEE - COMPLETE 4+ VIEW

[knee ap]
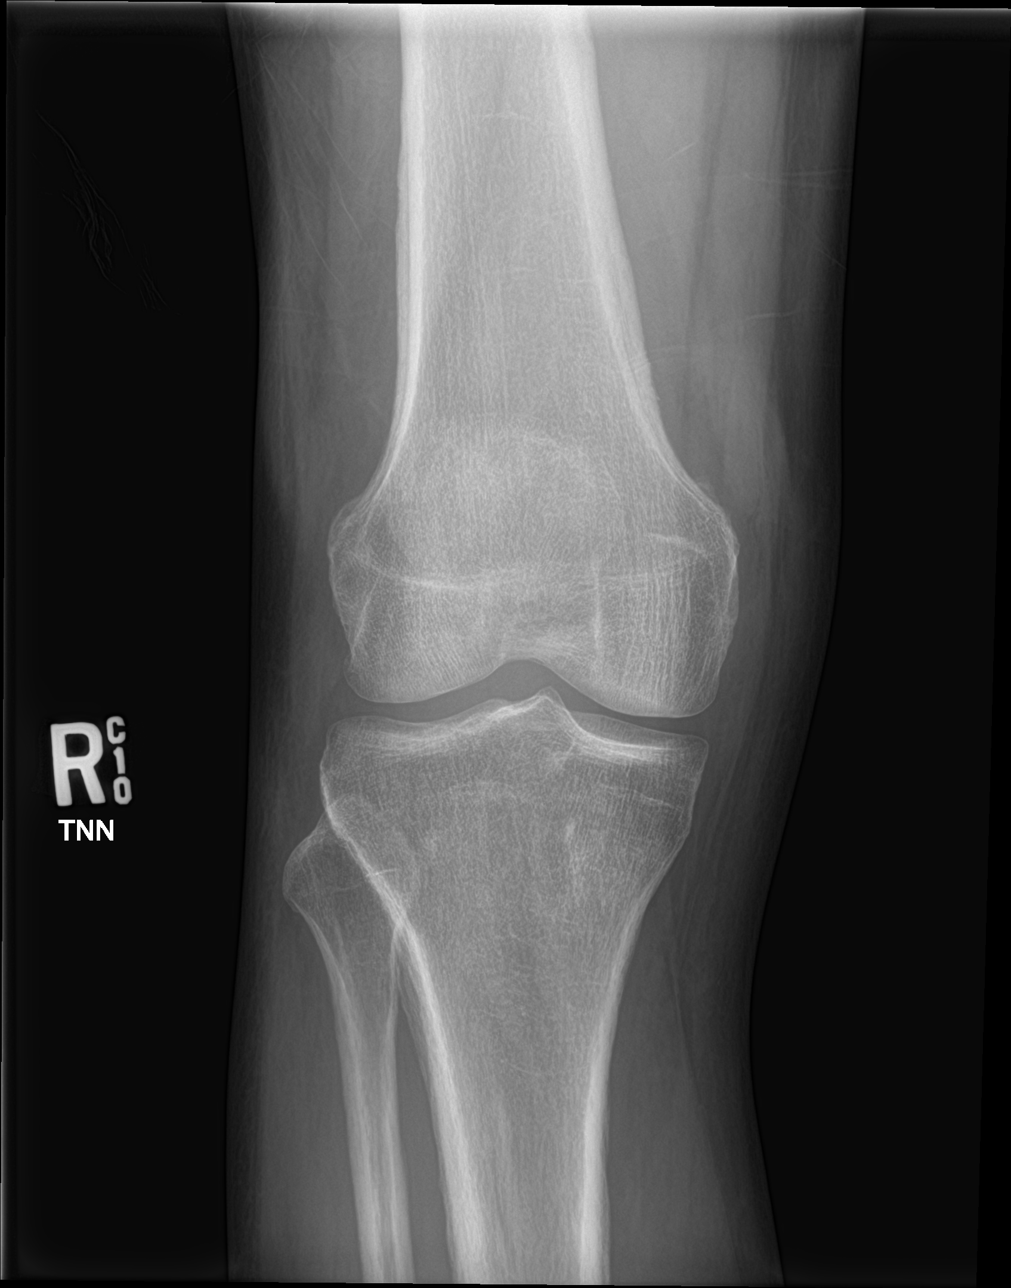

[knee obl (1 of 2)]
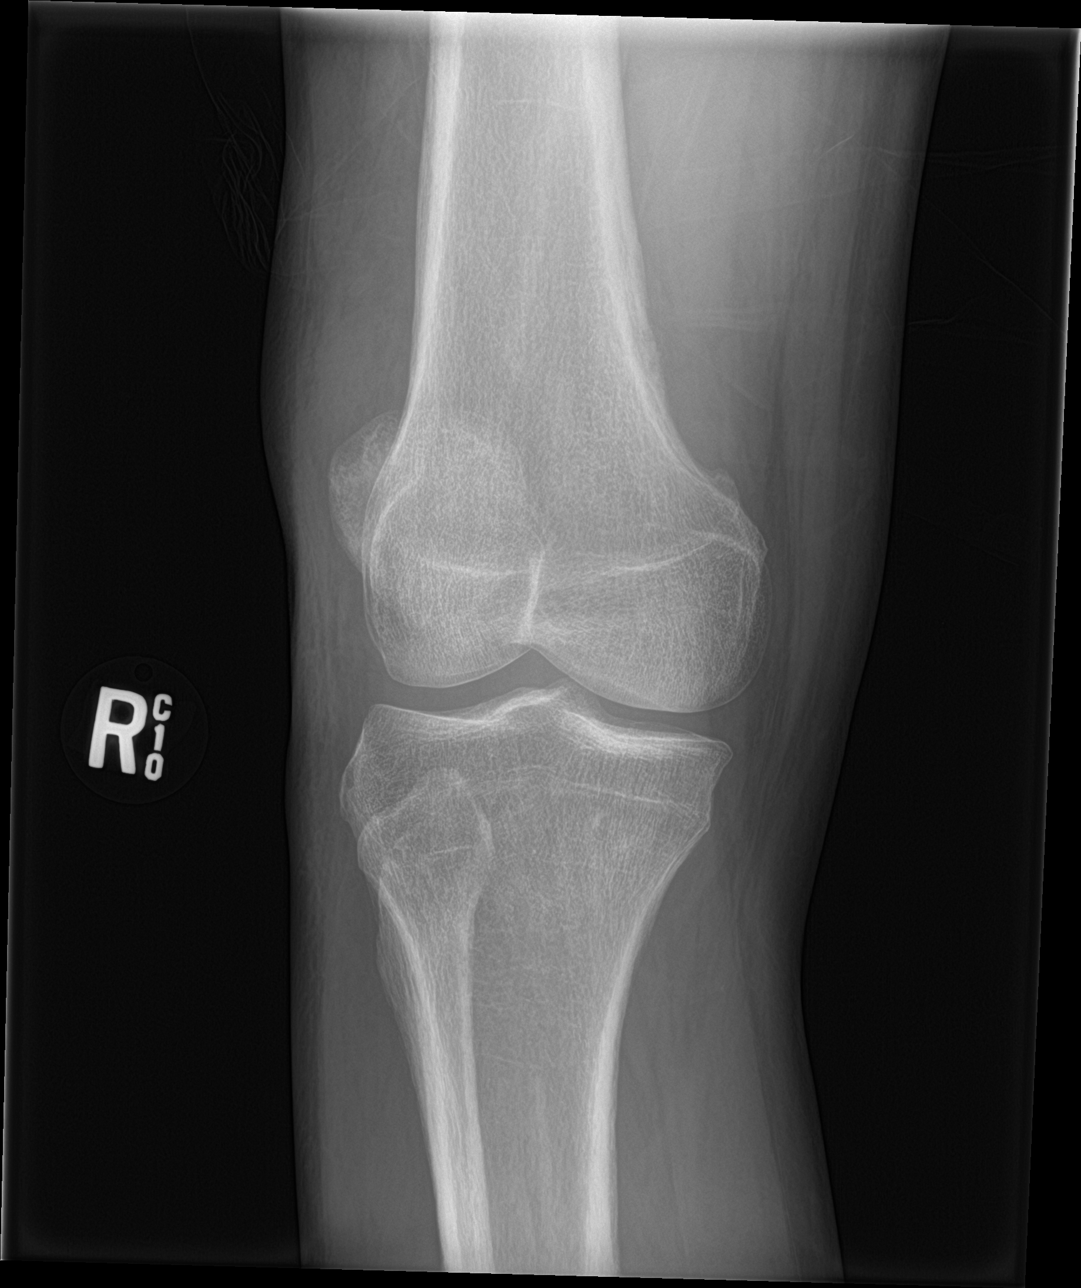

[knee obl (2 of 2)]
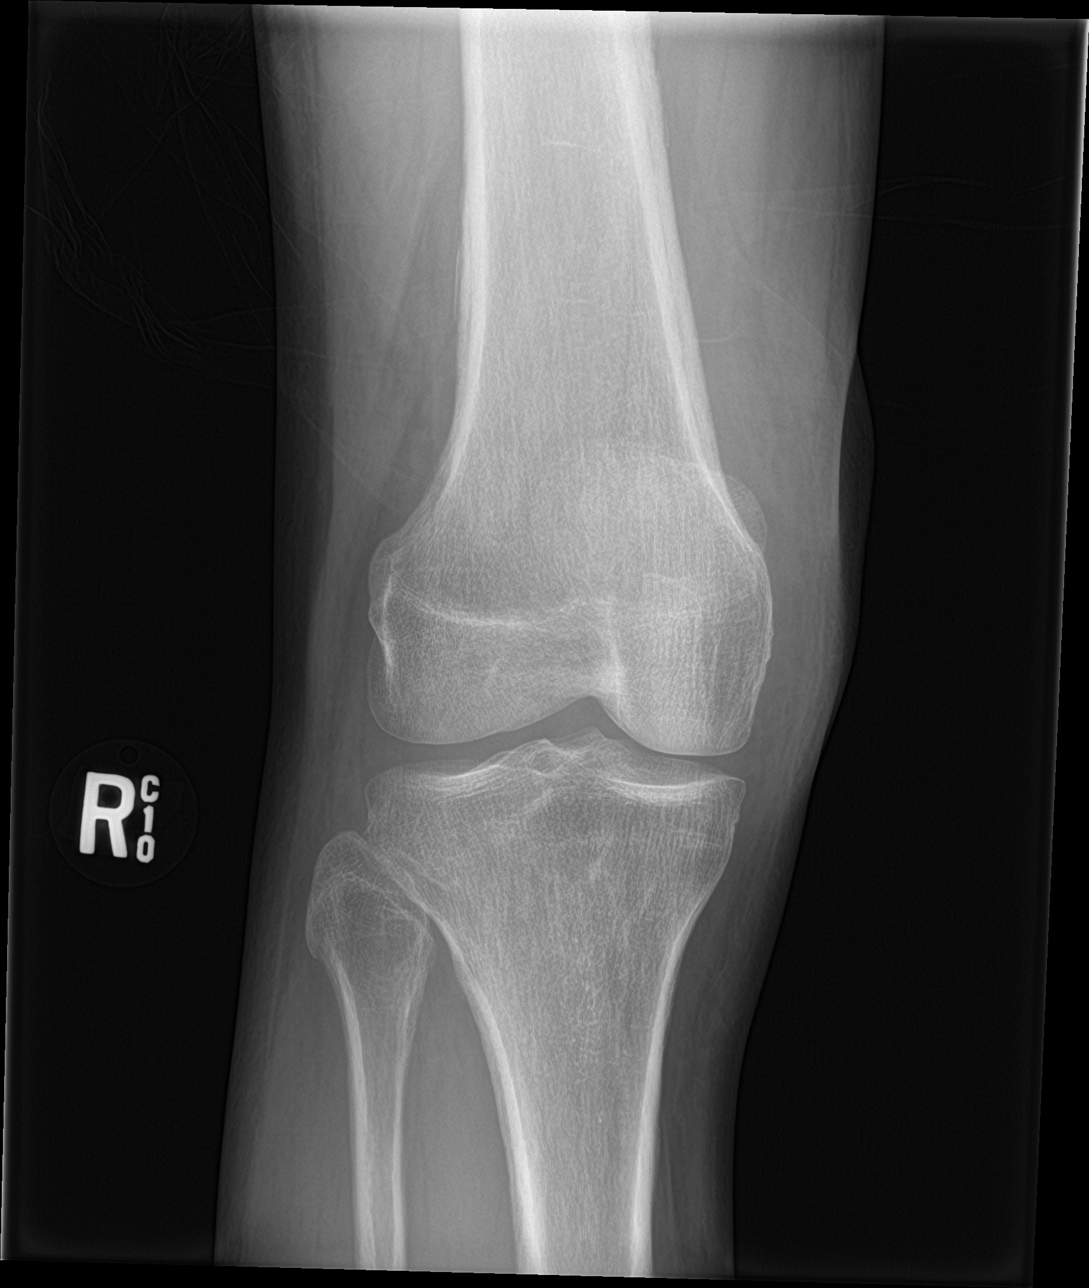

[knee lat]
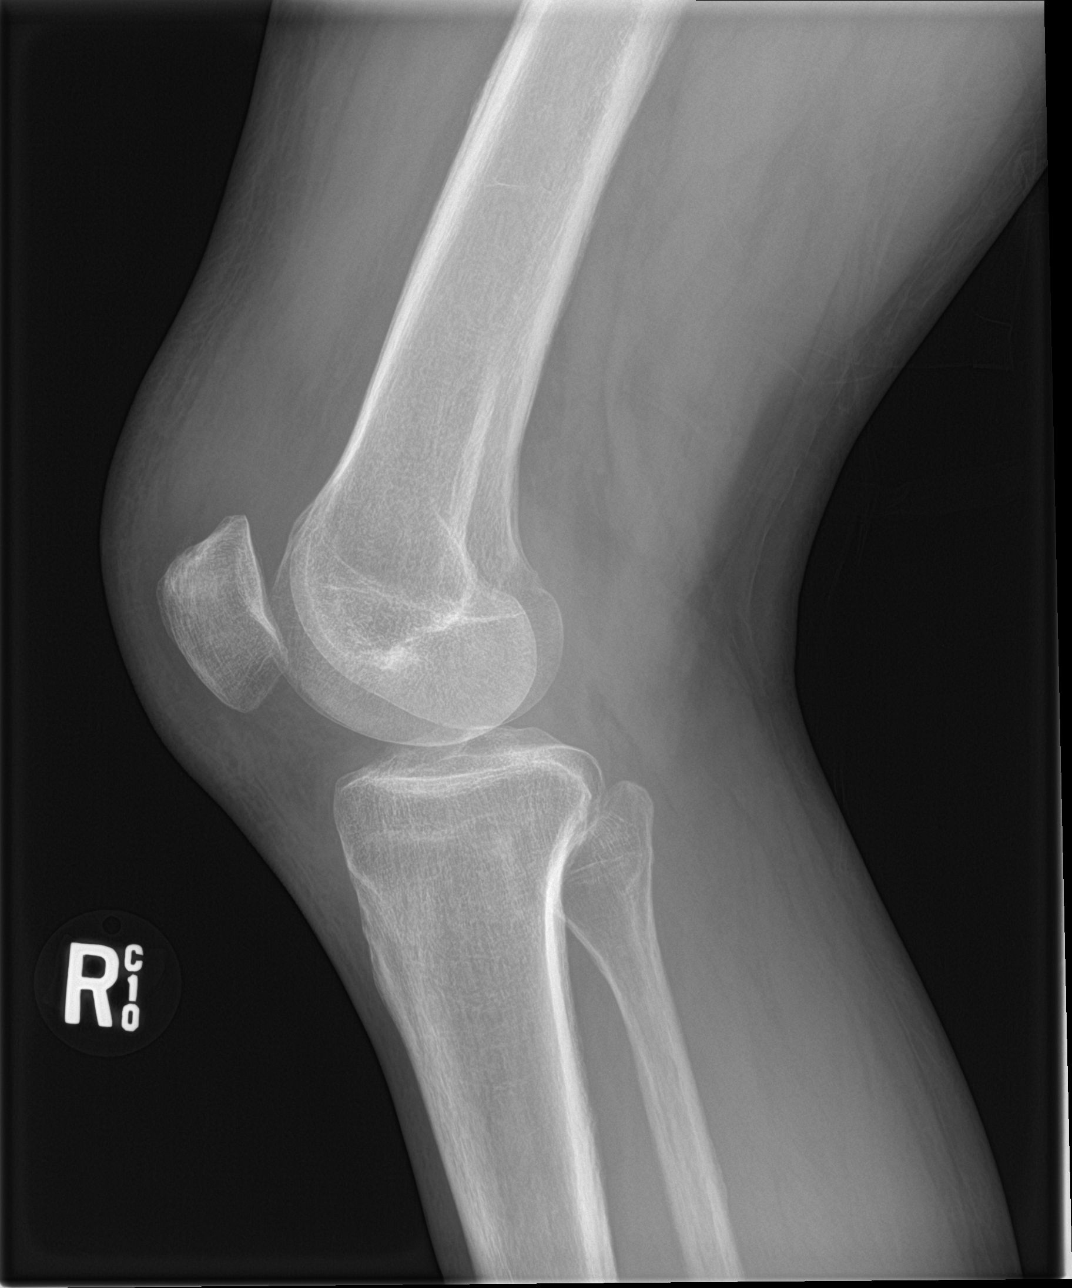

[4 of 4 positions shown; findings below may reference images not displayed]

FINDINGS: Significant soft tissue swelling is noted anteriorly. No acute
fracture or dislocation is seen. No joint effusion is noted.
IMPRESSION: Soft tissue swelling anteriorly without acute bony abnormality.

## 2021-01-02 ENCOUNTER — Ambulatory Visit
Admission: EM | Admit: 2021-01-02 | Discharge: 2021-01-02 | Disposition: A | Discharge status: Home / Self Care | Payer: Medicare Other | Attending: Internal Medicine | Admitting: Internal Medicine

## 2021-01-02 ENCOUNTER — Other Ambulatory Visit: Payer: Self-pay

## 2021-01-02 DIAGNOSIS — K047 Periapical abscess without sinus: Secondary | ICD-10-CM | POA: Diagnosis not present

## 2021-01-02 MED ORDER — LIDOCAINE VISCOUS HCL 2 % MT SOLN: OROMUCOSAL | 0 refills | Status: DC

## 2021-01-02 MED ORDER — PENICILLIN V POTASSIUM 500 MG PO TABS: 500.0000 mg | ORAL_TABLET | Freq: Four times a day (QID) | ORAL | 0 refills | Status: AC

## 2021-01-02 NOTE — ED Triage Notes (Signed)
Patient states that he has been having a dental abscess on bottom right lower since Friday evening. States that dentist couldn't see him until June 27th.

## 2021-01-02 NOTE — ED Provider Notes (Signed)
[facility]    CSN: [id number] Arrival date & time: 01/02/21  1041      History   Chief Complaint Chief Complaint  Patient presents with  . Dental Pain    HPI Andrew Rios is a 55 y.o. male who presents with R lower molar dental pain x 1 week. Tried to see his dentist this am, and they cant see him til 6/25. Was sent to another group for dental extraction and they can see him til July. His molars are chipped and all of them have been falling out after they crack.   HPI  Past Medical History:  Diagnosis Date  . Anginal pain ([facility])   . Cervical back pain with evidence of disc disease   . GERD (gastroesophageal reflux disease)   . History of back injury     Patient Active Problem List   Diagnosis Date Noted  . Back pain 04/09/2019  . Smoker 04/09/2019  . (HFpEF) heart failure with preserved ejection fraction ([facility]) 03/12/2019  . Leg swelling 03/12/2019  . Bruising 12/03/2018  . Benign essential HTN 07/03/2017  . Coronary artery disease involving native coronary artery of native heart without angina pectoris 07/01/2017  . Unstable angina pectoris ([facility]) 06/11/2017  . Low HDL (under 40) 04/23/2017  . Hypercholesterolemia 07/06/2015    Past Surgical History:  Procedure Laterality Date  . APPENDECTOMY    . LEFT HEART CATH AND CORONARY ANGIOGRAPHY N/A 06/12/2017   Procedure: LEFT HEART CATH AND CORONARY ANGIOGRAPHY;  Surgeon: Lamar Blinks, MD;  Location: [facility];  Service: Cardiovascular;  Laterality: N/A;       Home Medications    Prior to Admission medications   Medication Sig Start Date End Date Taking? Authorizing Provider  lidocaine (XYLOCAINE) 2 % solution Apply to area of dental pain every 6 hours 01/02/21  Yes Rodriguez-Southworth, Nettie Elm, PA-C  penicillin v potassium (VEETID) 500 MG tablet Take 1 tablet (500 mg total) by mouth 4 (four) times daily for 10 days. 01/02/21 01/12/21 Yes Rodriguez-Southworth, Nettie Elm, PA-C   atorvastatin (LIPITOR) 80 MG tablet TK 1/2 T PO ONCE D 03/16/19 01/02/21  [provider]  ezetimibe (ZETIA) 10 MG tablet  03/10/19 01/02/21  [provider]  fluticasone (FLONASE) 50 MCG/ACT nasal spray Place 1 spray into both nostrils 2 (two) times daily. 06/04/16 01/02/21  Lutricia Feil, PA-C  furosemide (LASIX) 20 MG tablet  02/11/19 01/02/21  [provider]  metoprolol succinate (TOPROL-XL) 25 MG 24 hr tablet Take 12.5 mg daily by mouth.  01/02/21  [provider]  nitroGLYCERIN (NITROSTAT) 0.4 MG SL tablet Place under the tongue. 06/10/17 01/02/21  [provider]    Family History Family History  Problem Relation Age of Onset  . Hypertension Mother   . Diabetes Father     Social History Social History   Tobacco Use  . Smoking status: Heavy Tobacco Smoker    Packs/day: 1.00    Years: 35.00    Pack years: 35.00  . Smokeless tobacco: Never Used  Substance Use Topics  . Alcohol use: No  . Drug use: No     Allergies   Gabapentin   Review of Systems Review of Systems  Constitutional: Negative for chills, diaphoresis and fever.  HENT: Positive for dental problem and facial swelling.   Skin: Negative for rash.  Hematological: Negative for adenopathy.     Physical Exam Triage Vital Signs ED Triage Vitals  Enc Vitals Group  BP 01/02/21 1136 133/83     Pulse Rate 01/02/21 1136 81     Resp 01/02/21 1136 17     Temp 01/02/21 1136 99.3 F (37.4 C)     Temp Source 01/02/21 1136 Oral     SpO2 01/02/21 1136 100 %     Weight 01/02/21 1134 150 lb (68 kg)     Height 01/02/21 1134 5' 7.5" (1.715 m)     Head Circumference --      Peak Flow --      Pain Score 01/02/21 1133 8     Pain Loc --      Pain Edu? --      Excl. in GC? --    No data found.  Updated Vital Signs BP 133/83 (BP Location: Left Arm)   Pulse 81   Temp 99.3 F (37.4 C) (Oral)   Resp 17   Ht 5' 7.5" (1.715 m)   Wt 150 lb (68 kg)   SpO2 100%   BMI 23.15  kg/m   Visual Acuity Right Eye Distance:   Left Eye Distance:   Bilateral Distance:    Right Eye Near:   Left Eye Near:    Bilateral Near:     Physical Exam Vitals and nursing note reviewed.  Constitutional:      General: He is not in acute distress.    Appearance: He is normal weight. He is not toxic-appearing.  HENT:     Head: Normocephalic.     Comments: R face is mildly swollen on the lower jaw area    Right Ear: External ear normal.     Left Ear: External ear normal.     Nose: Nose normal.     Mouth/Throat:     Mouth: Mucous membranes are moist.     Comments: Has poor dentition with most of his teeth cracked, the area of pain is R lower anterior molars which are chipped and the lateral area of the gum is red and swollen.  Eyes:     General: No scleral icterus.    Conjunctiva/sclera: Conjunctivae normal.  Pulmonary:     Effort: Pulmonary effort is normal.  Musculoskeletal:        General: Normal range of motion.     Cervical back: Neck supple.  Lymphadenopathy:     Cervical: No cervical adenopathy.  Skin:    General: Skin is warm and dry.  Neurological:     Mental Status: He is alert and oriented to person, place, and time.     Gait: Gait normal.  Psychiatric:        Mood and Affect: Mood normal.        Behavior: Behavior normal.        Thought Content: Thought content normal.        Judgment: Judgment normal.      UC Treatments / Results  Labs (all labs ordered are listed, but only abnormal results are displayed) Labs Reviewed - No data to display  EKG   Radiology No results found.  Procedures Procedures (including critical care time)  Medications Ordered in UC Medications - No data to display  Initial Impression / Assessment and Plan / UC Course  I have reviewed the triage vital signs and the nursing notes. Dental infection. I placed him on [facility] as noted and viscus xylocaine topically for area of pain Final Clinical Impressions(s) / UC  Diagnoses   Final diagnoses:  Dental infection   Discharge Instructions   None  ED Prescriptions    Medication Sig Dispense Auth. Provider   penicillin v potassium (VEETID) 500 MG tablet Take 1 tablet (500 mg total) by mouth 4 (four) times daily for 10 days. 40 tablet Rodriguez-Southworth, Gemma Ruan, PA-C   lidocaine (XYLOCAINE) 2 % solution Apply to area of dental pain every 6 hours 20 mL Rodriguez-Southworth, Nettie Elm, PA-C     I have reviewed the PDMP during this encounter.   Garey Ham, PA-C 01/02/21 1148

## 2022-05-30 DIAGNOSIS — R7303 Prediabetes: Secondary | ICD-10-CM | POA: Diagnosis not present

## 2022-05-30 DIAGNOSIS — Z79899 Other long term (current) drug therapy: Secondary | ICD-10-CM | POA: Diagnosis not present

## 2022-05-30 DIAGNOSIS — E782 Mixed hyperlipidemia: Secondary | ICD-10-CM | POA: Diagnosis not present

## 2022-05-30 DIAGNOSIS — R350 Frequency of micturition: Secondary | ICD-10-CM | POA: Diagnosis not present

## 2022-05-30 DIAGNOSIS — D519 Vitamin B12 deficiency anemia, unspecified: Secondary | ICD-10-CM | POA: Diagnosis not present

## 2022-05-30 DIAGNOSIS — M5136 Other intervertebral disc degeneration, lumbar region: Secondary | ICD-10-CM | POA: Diagnosis not present

## 2022-05-30 DIAGNOSIS — I10 Essential (primary) hypertension: Secondary | ICD-10-CM | POA: Diagnosis not present

## 2022-05-30 DIAGNOSIS — I2510 Atherosclerotic heart disease of native coronary artery without angina pectoris: Secondary | ICD-10-CM | POA: Diagnosis not present

## 2022-05-30 DIAGNOSIS — E559 Vitamin D deficiency, unspecified: Secondary | ICD-10-CM | POA: Diagnosis not present

## 2022-08-30 DIAGNOSIS — E559 Vitamin D deficiency, unspecified: Secondary | ICD-10-CM | POA: Diagnosis not present

## 2022-08-30 DIAGNOSIS — E782 Mixed hyperlipidemia: Secondary | ICD-10-CM | POA: Diagnosis not present

## 2022-08-30 DIAGNOSIS — L235 Allergic contact dermatitis due to other chemical products: Secondary | ICD-10-CM | POA: Diagnosis not present

## 2022-08-30 DIAGNOSIS — M545 Low back pain, unspecified: Secondary | ICD-10-CM | POA: Diagnosis not present

## 2022-08-30 DIAGNOSIS — R350 Frequency of micturition: Secondary | ICD-10-CM | POA: Diagnosis not present

## 2022-08-30 DIAGNOSIS — H01112 Allergic dermatitis of right lower eyelid: Secondary | ICD-10-CM | POA: Diagnosis not present

## 2022-08-30 DIAGNOSIS — D519 Vitamin B12 deficiency anemia, unspecified: Secondary | ICD-10-CM | POA: Diagnosis not present

## 2022-08-30 DIAGNOSIS — I10 Essential (primary) hypertension: Secondary | ICD-10-CM | POA: Diagnosis not present

## 2022-09-09 ENCOUNTER — Telehealth: Payer: Self-pay

## 2022-09-09 NOTE — Telephone Encounter (Signed)
Pt LM asking about lab results- ao

## 2022-09-12 ENCOUNTER — Other Ambulatory Visit: Payer: Self-pay

## 2022-09-12 MED ORDER — VITAMIN D (ERGOCALCIFEROL) 1.25 MG (50000 UNIT) PO CAPS: 50000.0000 [IU] | ORAL_CAPSULE | ORAL | 3 refills | Status: DC

## 2022-09-30 ENCOUNTER — Other Ambulatory Visit: Payer: Self-pay | Admitting: Family

## 2022-10-29 ENCOUNTER — Other Ambulatory Visit: Payer: Self-pay | Admitting: Family

## 2022-11-29 ENCOUNTER — Ambulatory Visit (INDEPENDENT_AMBULATORY_CARE_PROVIDER_SITE_OTHER): Payer: 59 | Admitting: Family

## 2022-11-29 VITALS — BP 120/62 | HR 64 | Ht 69.5 in | Wt 162.4 lb

## 2022-11-29 DIAGNOSIS — E559 Vitamin D deficiency, unspecified: Secondary | ICD-10-CM | POA: Diagnosis not present

## 2022-11-29 DIAGNOSIS — R7303 Prediabetes: Secondary | ICD-10-CM | POA: Diagnosis not present

## 2022-11-29 DIAGNOSIS — R5383 Other fatigue: Secondary | ICD-10-CM

## 2022-11-29 DIAGNOSIS — E538 Deficiency of other specified B group vitamins: Secondary | ICD-10-CM

## 2022-11-29 DIAGNOSIS — E782 Mixed hyperlipidemia: Secondary | ICD-10-CM | POA: Diagnosis not present

## 2022-11-29 DIAGNOSIS — M5441 Lumbago with sciatica, right side: Secondary | ICD-10-CM

## 2022-11-29 DIAGNOSIS — M5442 Lumbago with sciatica, left side: Secondary | ICD-10-CM | POA: Diagnosis not present

## 2022-11-29 DIAGNOSIS — E7800 Pure hypercholesterolemia, unspecified: Secondary | ICD-10-CM | POA: Diagnosis not present

## 2022-11-29 DIAGNOSIS — G8929 Other chronic pain: Secondary | ICD-10-CM | POA: Diagnosis not present

## 2022-11-29 DIAGNOSIS — I10 Essential (primary) hypertension: Secondary | ICD-10-CM

## 2022-11-29 MED ORDER — OXYCODONE HCL 10 MG PO TABS: ORAL_TABLET | ORAL | 0 refills | Status: DC

## 2022-11-30 LAB — CBC WITH DIFFERENTIAL
Basophils Absolute: 0.1 10*3/uL (ref 0.0–0.2)
Basos: 1 %
EOS (ABSOLUTE): 0.1 10*3/uL (ref 0.0–0.4)
Eos: 1 %
Hematocrit: 40.8 % (ref 37.5–51.0)
Hemoglobin: 13.5 g/dL (ref 13.0–17.7)
Immature Grans (Abs): 0 10*3/uL (ref 0.0–0.1)
Immature Granulocytes: 0 %
Lymphocytes Absolute: 2.7 10*3/uL (ref 0.7–3.1)
Lymphs: 29 %
MCH: 31.3 pg (ref 26.6–33.0)
MCHC: 33.1 g/dL (ref 31.5–35.7)
MCV: 94 fL (ref 79–97)
Monocytes Absolute: 0.7 10*3/uL (ref 0.1–0.9)
Monocytes: 8 %
Neutrophils Absolute: 5.6 10*3/uL (ref 1.4–7.0)
Neutrophils: 61 %
RBC: 4.32 x10E6/uL (ref 4.14–5.80)
RDW: 13.3 % (ref 11.6–15.4)
WBC: 9.2 10*3/uL (ref 3.4–10.8)

## 2022-11-30 LAB — CMP14+EGFR
ALT: 6 IU/L (ref 0–44)
AST: 12 IU/L (ref 0–40)
Albumin/Globulin Ratio: 1.7 (ref 1.2–2.2)
Albumin: 4.1 g/dL (ref 3.8–4.9)
Alkaline Phosphatase: 110 IU/L (ref 44–121)
BUN/Creatinine Ratio: 9 (ref 9–20)
BUN: 9 mg/dL (ref 6–24)
Bilirubin Total: 0.4 mg/dL (ref 0.0–1.2)
CO2: 23 mmol/L (ref 20–29)
Calcium: 9.5 mg/dL (ref 8.7–10.2)
Chloride: 101 mmol/L (ref 96–106)
Creatinine, Ser: 1.01 mg/dL (ref 0.76–1.27)
Globulin, Total: 2.4 g/dL (ref 1.5–4.5)
Glucose: 91 mg/dL (ref 70–99)
Potassium: 5.1 mmol/L (ref 3.5–5.2)
Sodium: 141 mmol/L (ref 134–144)
Total Protein: 6.5 g/dL (ref 6.0–8.5)
eGFR: 88 mL/min/{1.73_m2} (ref >59)

## 2022-11-30 LAB — HEMOGLOBIN A1C
Est. average glucose Bld gHb Est-mCnc: 123 mg/dL
Hgb A1c MFr Bld: 5.9 % — ABNORMAL HIGH (ref 4.8–5.6)

## 2022-11-30 LAB — LIPID PANEL
Chol/HDL Ratio: 3.8 ratio (ref 0.0–5.0)
Cholesterol, Total: 137 mg/dL (ref 100–199)
HDL: 36 mg/dL — ABNORMAL LOW (ref >39)
LDL Chol Calc (NIH): 85 mg/dL (ref 0–99)
Triglycerides: 84 mg/dL (ref 0–149)
VLDL Cholesterol Cal: 16 mg/dL (ref 5–40)

## 2022-11-30 LAB — VITAMIN B12: Vitamin B-12: 305 pg/mL (ref 232–1245)

## 2022-11-30 LAB — TSH: TSH: 2.48 u[IU]/mL (ref 0.450–4.500)

## 2022-11-30 LAB — VITAMIN D 25 HYDROXY (VIT D DEFICIENCY, FRACTURES): Vit D, 25-Hydroxy: 29 ng/mL — ABNORMAL LOW (ref 30.0–100.0)

## 2022-12-03 ENCOUNTER — Encounter: Payer: Self-pay | Admitting: Family

## 2022-12-03 NOTE — Progress Notes (Signed)
Established Patient Office Visit  Subjective:  Patient ID: Andrew Rios, male    DOB: [birth year]  Age: 57 y.o. MRN: [id number]  Chief Complaint  Patient presents with   Follow-up    3 month follow up    Patient is here today for his 3 months follow up.  He has been feeling fairly well since last appointment.   He does not have additional concerns to discuss today.  Labs are due today. He needs refills.   I have reviewed his active problem list, medication list, allergies, notes from last encounter, lab results for his appointment today.   Patient has been informed of the need to set up pain clinic referral for him, and is aware that I will only be prescribing for him until such time as he has been set up for an appointment there.   No other concerns at this time.   Past Medical History:  Diagnosis Date   Anginal pain (HCC)    Cervical back pain with evidence of disc disease    GERD (gastroesophageal reflux disease)    History of back injury     Past Surgical History:  Procedure Laterality Date   APPENDECTOMY     LEFT HEART CATH AND CORONARY ANGIOGRAPHY N/A 06/12/2017   Procedure: LEFT HEART CATH AND CORONARY ANGIOGRAPHY;  Surgeon: Lamar Blinks, MD;  Location: [facility] INVASIVE CV LAB;  Service: Cardiovascular;  Laterality: N/A;    Social History   Socioeconomic History   Marital status: Divorced    Spouse name: Not on file   Number of children: Not on file   Years of education: Not on file   Highest education level: Not on file  Occupational History   Not on file  Tobacco Use   Smoking status: Heavy Smoker    Packs/day: 1.00    Years: 35.00    Additional pack years: 0.00    Total pack years: 35.00    Types: Cigarettes   Smokeless tobacco: Never  Substance and Sexual Activity   Alcohol use: No   Drug use: No   Sexual activity: Yes    Birth control/protection: Condom  Other Topics Concern   Not on file  Social History Narrative   Not on file    Social Determinants of Health   Financial Resource Strain: Not on file  Food Insecurity: Not on file  Transportation Needs: Not on file  Physical Activity: Not on file  Stress: Not on file  Social Connections: Not on file  Intimate Partner Violence: Not on file    Family History  Problem Relation Age of Onset   Hypertension Mother    Diabetes Father     Allergies  Allergen Reactions   Gabapentin Other (See Comments)    Made him nauseous and tried  Made him nauseous and tried  Made him nauseous and tried    Review of Systems  Musculoskeletal:  Positive for back pain and joint pain.  All other systems reviewed and are negative.      Objective:   BP 120/62   Pulse 64   Ht 5' 9.5" (1.765 m)   Wt 162 lb 6.4 oz (73.7 kg)   BMI 23.64 kg/m   Vitals:   11/29/22 1049  BP: 120/62  Pulse: 64  Height: 5' 9.5" (1.765 m)  Weight: 162 lb 6.4 oz (73.7 kg)  BMI (Calculated): 23.65    Physical Exam Vitals and nursing note reviewed.  Constitutional:      Appearance:  Normal appearance. He is normal weight.  Eyes:     Extraocular Movements: Extraocular movements intact.     Conjunctiva/sclera: Conjunctivae normal.     Pupils: Pupils are equal, round, and reactive to light.  Cardiovascular:     Rate and Rhythm: Normal rate and regular rhythm.     Pulses: Normal pulses.     Heart sounds: Normal heart sounds.  Pulmonary:     Effort: Pulmonary effort is normal.     Breath sounds: Normal breath sounds.  Musculoskeletal:     Lumbar back: Spasms and tenderness present.  Neurological:     General: No focal deficit present.     Mental Status: He is alert and oriented to person, place, and time. Mental status is at baseline.  Psychiatric:        Mood and Affect: Mood normal.        Behavior: Behavior normal.      Results for orders placed or performed in visit on 11/29/22  Lipid panel  Result Value Ref Range   Cholesterol, Total 137 100 - 199 mg/dL    Triglycerides 84 0 - 149 mg/dL   HDL 36 (L) >40 mg/dL   VLDL Cholesterol Cal 16 5 - 40 mg/dL   LDL Chol Calc (NIH) 85 0 - 99 mg/dL   Chol/HDL Ratio 3.8 0.0 - 5.0 ratio  VITAMIN D 25 Hydroxy (Vit-D Deficiency, Fractures)  Result Value Ref Range   Vit D, 25-Hydroxy 29.0 (L) 30.0 - 100.0 ng/mL  CBC With Differential  Result Value Ref Range   WBC 9.2 3.4 - 10.8 x10E3/uL   RBC 4.32 4.14 - 5.80 x10E6/uL   Hemoglobin 13.5 13.0 - 17.7 g/dL   Hematocrit 98.1 19.1 - 51.0 %   MCV 94 79 - 97 fL   MCH 31.3 26.6 - 33.0 pg   MCHC 33.1 31.5 - 35.7 g/dL   RDW 47.8 29.5 - 62.1 %   Neutrophils 61 Not Estab. %   Lymphs 29 Not Estab. %   Monocytes 8 Not Estab. %   Eos 1 Not Estab. %   Basos 1 Not Estab. %   Neutrophils Absolute 5.6 1.4 - 7.0 x10E3/uL   Lymphocytes Absolute 2.7 0.7 - 3.1 x10E3/uL   Monocytes Absolute 0.7 0.1 - 0.9 x10E3/uL   EOS (ABSOLUTE) 0.1 0.0 - 0.4 x10E3/uL   Basophils Absolute 0.1 0.0 - 0.2 x10E3/uL   Immature Granulocytes 0 Not Estab. %   Immature Grans (Abs) 0.0 0.0 - 0.1 x10E3/uL  CMP14+EGFR  Result Value Ref Range   Glucose 91 70 - 99 mg/dL   BUN 9 6 - 24 mg/dL   Creatinine, Ser 3.08 0.76 - 1.27 mg/dL   eGFR 88 >65 HQ/ION/6.29   BUN/Creatinine Ratio 9 9 - 20   Sodium 141 134 - 144 mmol/L   Potassium 5.1 3.5 - 5.2 mmol/L   Chloride 101 96 - 106 mmol/L   CO2 23 20 - 29 mmol/L   Calcium 9.5 8.7 - 10.2 mg/dL   Total Protein 6.5 6.0 - 8.5 g/dL   Albumin 4.1 3.8 - 4.9 g/dL   Globulin, Total 2.4 1.5 - 4.5 g/dL   Albumin/Globulin Ratio 1.7 1.2 - 2.2   Bilirubin Total 0.4 0.0 - 1.2 mg/dL   Alkaline Phosphatase 110 44 - 121 IU/L   AST 12 0 - 40 IU/L   ALT 6 0 - 44 IU/L  TSH  Result Value Ref Range   TSH 2.480 0.450 - 4.500 uIU/mL  Hemoglobin A1c  Result Value Ref Range   Hgb A1c MFr Bld 5.9 (H) 4.8 - 5.6 %   Est. average glucose Bld gHb Est-mCnc 123 mg/dL  Vitamin Z61  Result Value Ref Range   Vitamin B-12 305 232 - 1,245 pg/mL    Recent Results (from the past  2160 hour(s))  Lipid panel     Status: Abnormal   Collection Time: 11/29/22 11:59 AM  Result Value Ref Range   Cholesterol, Total 137 100 - 199 mg/dL   Triglycerides 84 0 - 149 mg/dL   HDL 36 (L) >09 mg/dL   VLDL Cholesterol Cal 16 5 - 40 mg/dL   LDL Chol Calc (NIH) 85 0 - 99 mg/dL   Chol/HDL Ratio 3.8 0.0 - 5.0 ratio    Comment:                                   T. Chol/HDL Ratio                                             Men  Women                               1/2 Avg.Risk  3.4    3.3                                   Avg.Risk  5.0    4.4                                2X Avg.Risk  9.6    7.1                                3X Avg.Risk 23.4   11.0   VITAMIN D 25 Hydroxy (Vit-D Deficiency, Fractures)     Status: Abnormal   Collection Time: 11/29/22 11:59 AM  Result Value Ref Range   Vit D, 25-Hydroxy 29.0 (L) 30.0 - 100.0 ng/mL    Comment: Vitamin D deficiency has been defined by the Institute of Medicine and an Endocrine Society practice guideline as a level of serum 25-OH vitamin D less than 20 ng/mL (1,2). The Endocrine Society went on to further define vitamin D insufficiency as a level between 21 and 29 ng/mL (2). 1. IOM (Institute of Medicine). 2010. Dietary reference    intakes for calcium and D. [place]: The    [employer]. 2. Holick MF, Binkley [place], Bischoff-Ferrari HA, et al.    Evaluation, treatment, and prevention of vitamin D    deficiency: an Endocrine Society clinical practice    guideline. JCEM. 2011 Jul; 96(7):1911-30.   CBC With Differential     Status: None   Collection Time: 11/29/22 11:59 AM  Result Value Ref Range   WBC 9.2 3.4 - 10.8 x10E3/uL   RBC 4.32 4.14 - 5.80 x10E6/uL   Hemoglobin 13.5 13.0 - 17.7 g/dL   Hematocrit 60.4 54.0 - 51.0 %   MCV 94 79 - 97 fL   MCH 31.3 26.6 - 33.0 pg   MCHC 33.1  31.5 - 35.7 g/dL   RDW 16.1 09.6 - 04.5 %   Neutrophils 61 Not Estab. %   Lymphs 29 Not Estab. %   Monocytes 8 Not Estab. %   Eos 1  Not Estab. %   Basos 1 Not Estab. %   Neutrophils Absolute 5.6 1.4 - 7.0 x10E3/uL   Lymphocytes Absolute 2.7 0.7 - 3.1 x10E3/uL   Monocytes Absolute 0.7 0.1 - 0.9 x10E3/uL   EOS (ABSOLUTE) 0.1 0.0 - 0.4 x10E3/uL   Basophils Absolute 0.1 0.0 - 0.2 x10E3/uL   Immature Granulocytes 0 Not Estab. %   Immature Grans (Abs) 0.0 0.0 - 0.1 x10E3/uL  CMP14+EGFR     Status: None   Collection Time: 11/29/22 11:59 AM  Result Value Ref Range   Glucose 91 70 - 99 mg/dL   BUN 9 6 - 24 mg/dL   Creatinine, Ser 4.09 0.76 - 1.27 mg/dL   eGFR 88 >81 XB/JYN/8.29   BUN/Creatinine Ratio 9 9 - 20   Sodium 141 134 - 144 mmol/L   Potassium 5.1 3.5 - 5.2 mmol/L   Chloride 101 96 - 106 mmol/L   CO2 23 20 - 29 mmol/L   Calcium 9.5 8.7 - 10.2 mg/dL   Total Protein 6.5 6.0 - 8.5 g/dL   Albumin 4.1 3.8 - 4.9 g/dL   Globulin, Total 2.4 1.5 - 4.5 g/dL   Albumin/Globulin Ratio 1.7 1.2 - 2.2   Bilirubin Total 0.4 0.0 - 1.2 mg/dL   Alkaline Phosphatase 110 44 - 121 IU/L   AST 12 0 - 40 IU/L   ALT 6 0 - 44 IU/L  TSH     Status: None   Collection Time: 11/29/22 11:59 AM  Result Value Ref Range   TSH 2.480 0.450 - 4.500 uIU/mL  Hemoglobin A1c     Status: Abnormal   Collection Time: 11/29/22 11:59 AM  Result Value Ref Range   Hgb A1c MFr Bld 5.9 (H) 4.8 - 5.6 %    Comment:          Prediabetes: 5.7 - 6.4          Diabetes: >6.4          Glycemic control for adults with diabetes: <7.0    Est. average glucose Bld gHb Est-mCnc 123 mg/dL  Vitamin F62     Status: None   Collection Time: 11/29/22 11:59 AM  Result Value Ref Range   Vitamin B-12 305 232 - 1,245 pg/mL       Assessment & Plan:   Problem List Items Addressed This Visit       Other   Back pain   Relevant Medications   tiZANidine (ZANAFLEX) 2 MG tablet   aspirin 81 MG chewable tablet   Oxycodone HCl 10 MG TABS   Other Relevant Orders   Ambulatory referral to Pain Clinic   Hypercholesterolemia - Primary   Relevant Medications   atorvastatin  (LIPITOR) 40 MG tablet   ezetimibe (ZETIA) 10 MG tablet   furosemide (LASIX) 20 MG tablet   metoprolol succinate (TOPROL-XL) 25 MG 24 hr tablet   aspirin 81 MG chewable tablet   Other Relevant Orders   Lipid panel (Completed)   CBC With Differential (Completed)   CMP14+EGFR (Completed)   Other Visit Diagnoses     B12 deficiency due to diet       Relevant Orders   CBC With Differential (Completed)   CMP14+EGFR (Completed)   Vitamin B12 (Completed)   Essential hypertension, benign  Relevant Medications   atorvastatin (LIPITOR) 40 MG tablet   ezetimibe (ZETIA) 10 MG tablet   furosemide (LASIX) 20 MG tablet   metoprolol succinate (TOPROL-XL) 25 MG 24 hr tablet   aspirin 81 MG chewable tablet   Other Relevant Orders   CBC With Differential (Completed)   CMP14+EGFR (Completed)   Mixed hyperlipidemia       Relevant Medications   atorvastatin (LIPITOR) 40 MG tablet   ezetimibe (ZETIA) 10 MG tablet   furosemide (LASIX) 20 MG tablet   metoprolol succinate (TOPROL-XL) 25 MG 24 hr tablet   aspirin 81 MG chewable tablet   Other Relevant Orders   Lipid panel (Completed)   CBC With Differential (Completed)   CMP14+EGFR (Completed)   Prediabetes       Relevant Orders   CBC With Differential (Completed)   CMP14+EGFR (Completed)   Hemoglobin A1c (Completed)   Vitamin D deficiency, unspecified       Relevant Orders   VITAMIN D 25 Hydroxy (Vit-D Deficiency, Fractures) (Completed)   CBC With Differential (Completed)   CMP14+EGFR (Completed)   Other fatigue       Relevant Orders   CBC With Differential (Completed)   CMP14+EGFR (Completed)   TSH (Completed)       Return in about 1 month (around 12/29/2022) for F/U.   Total time spent: 30 minutes  Miki Kins, FNP  11/29/2022

## 2022-12-20 ENCOUNTER — Encounter: Payer: Self-pay | Admitting: Family

## 2022-12-30 ENCOUNTER — Encounter: Payer: Self-pay | Admitting: Family

## 2022-12-30 ENCOUNTER — Ambulatory Visit (INDEPENDENT_AMBULATORY_CARE_PROVIDER_SITE_OTHER): Payer: 59 | Admitting: Family

## 2022-12-30 VITALS — BP 112/70 | HR 53 | Ht 69.5 in | Wt 158.0 lb

## 2022-12-30 DIAGNOSIS — I200 Unstable angina: Secondary | ICD-10-CM | POA: Diagnosis not present

## 2022-12-30 DIAGNOSIS — I10 Essential (primary) hypertension: Secondary | ICD-10-CM

## 2022-12-30 DIAGNOSIS — I5032 Chronic diastolic (congestive) heart failure: Secondary | ICD-10-CM

## 2022-12-30 DIAGNOSIS — F17213 Nicotine dependence, cigarettes, with withdrawal: Secondary | ICD-10-CM | POA: Insufficient documentation

## 2022-12-30 DIAGNOSIS — M5441 Lumbago with sciatica, right side: Secondary | ICD-10-CM

## 2022-12-30 DIAGNOSIS — E7800 Pure hypercholesterolemia, unspecified: Secondary | ICD-10-CM | POA: Diagnosis not present

## 2022-12-30 DIAGNOSIS — M5442 Lumbago with sciatica, left side: Secondary | ICD-10-CM | POA: Diagnosis not present

## 2022-12-30 DIAGNOSIS — G8929 Other chronic pain: Secondary | ICD-10-CM

## 2022-12-30 DIAGNOSIS — F17200 Nicotine dependence, unspecified, uncomplicated: Secondary | ICD-10-CM

## 2022-12-30 MED ORDER — OXYCODONE HCL 10 MG PO TABS
ORAL_TABLET | ORAL | 0 refills | Status: DC
Start: 2022-12-30 — End: 2023-01-30

## 2022-12-30 NOTE — Assessment & Plan Note (Signed)
CT Low Dose Screening for Lung Ca. Ordered.

## 2022-12-30 NOTE — Assessment & Plan Note (Signed)
Sending refills and pain clinic referral for pt. Will continue to bridge therapy until patient is established

## 2022-12-30 NOTE — Assessment & Plan Note (Signed)
Continue current therapy for lipid control. Will modify as needed based on labwork results.   

## 2022-12-30 NOTE — Assessment & Plan Note (Signed)
Patient is under the care of cardiology.  He is doing well currently, and has no concerns.  They are treating this.   Will defer to them for further treatment changes.

## 2022-12-30 NOTE — Progress Notes (Signed)
Established Patient Office Visit  Subjective:  Patient ID: Andrew Rios, male    DOB: 1966-07-18  Age: 57 y.o. MRN: [id number]  Chief Complaint  Patient presents with   Follow-up    1 month follow up    Patient is here today for his 1 month follow up.  He has been feeling fairly well since last appointment.  He has not yet heard from the pain clinic we previously sent.    He does not have additional concerns to discuss today.  Labs are not due today. He needs refills.   He is in the process of being set up with a pain clinic, and had not heard from the previous one we sent the referral to. Resending today, I will bridge his meds until he is taken care of.  I have reviewed his active problem list, medication list, allergies, notes from last encounter, lab results for his appointment today.      No other concerns at this time.   Past Medical History:  Diagnosis Date   Anginal pain (HCC)    Cervical back pain with evidence of disc disease    GERD (gastroesophageal reflux disease)    History of back injury     Past Surgical History:  Procedure Laterality Date   APPENDECTOMY     LEFT HEART CATH AND CORONARY ANGIOGRAPHY N/A 06/12/2017   Procedure: LEFT HEART CATH AND CORONARY ANGIOGRAPHY;  Surgeon: Lamar Blinks, MD;  Location: [facility] INV[facility]IVE CV LAB;  Service: Cardiovascular;  Laterality: N/A;    Social History   Socioeconomic History   Marital status: Divorced    Spouse name: Not on file   Number of children: Not on file   Years of education: Not on file   Highest education level: Not on file  Occupational History   Not on file  Tobacco Use   Smoking status: Heavy Smoker    Packs/day: 1.00    Years: 35.00    Additional pack years: 0.00    Total pack years: 35.00    Types: Cigarettes   Smokeless tobacco: Never  Substance and Sexual Activity   Alcohol use: No   Drug use: No   Sexual activity: Yes    Birth control/protection: Condom  Other  Topics Concern   Not on file  Social History Narrative   Not on file   Social Determinants of Health   Financial Resource Strain: Not on file  Food Insecurity: Not on file  Transportation Needs: Not on file  Physical Activity: Not on file  Stress: Not on file  Social Connections: Not on file  Intimate Partner Violence: Not on file    Family History  Problem Relation Age of Onset   Hypertension Mother    Diabetes Father     Allergies  Allergen Reactions   Gabapentin Other (See Comments)    Made him nauseous and tried  Made him nauseous and tried  Made him nauseous and tried    Review of Systems  Musculoskeletal:  Positive for back pain and joint pain.  All other systems reviewed and are negative.      Objective:   BP 112/70   Pulse (!) 53   Ht 5' 9.5" (1.765 m)   Wt 158 lb (71.7 kg)   SpO2 97%   BMI 23.00 kg/m   Vitals:   12/30/22 1056  BP: 112/70  Pulse: (!) 53  Height: 5' 9.5" (1.765 m)  Weight: 158 lb (71.7 kg)  SpO2: 97%  BMI (Calculated): 23.01    Physical Exam Vitals and nursing note reviewed.  Constitutional:      Appearance: Normal appearance. He is normal weight.  Eyes:     Pupils: Pupils are equal, round, and reactive to light.  Cardiovascular:     Rate and Rhythm: Normal rate and regular rhythm.     Pulses: Normal pulses.     Heart sounds: Normal heart sounds.  Pulmonary:     Effort: Pulmonary effort is normal.     Breath sounds: Normal breath sounds.  Musculoskeletal:     Lumbar back: Spasms and tenderness present. Positive right straight leg raise test and positive left straight leg raise test.  Neurological:     General: No focal deficit present.     Mental Status: He is alert and oriented to person, place, and time. Mental status is at baseline.  Psychiatric:        Mood and Affect: Mood normal.        Behavior: Behavior normal.      No results found for any visits on 12/30/22.  Recent Results (from the past 2160  hour(s))  Lipid panel     Status: Abnormal   Collection Time: 11/29/22 11:59 AM  Result Value Ref Range   Cholesterol, Total 137 100 - 199 mg/dL   Triglycerides 84 0 - 149 mg/dL   HDL 36 (L) >01 mg/dL   VLDL Cholesterol Cal 16 5 - 40 mg/dL   LDL Chol Calc (NIH) 85 0 - 99 mg/dL   Chol/HDL Ratio 3.8 0.0 - 5.0 ratio    Comment:                                   T. Chol/HDL Ratio                                             Men  Women                               1/2 Avg.Risk  3.4    3.3                                   Avg.Risk  5.0    4.4                                2X Avg.Risk  9.6    7.1                                3X Avg.Risk 23.4   11.0   VITAMIN D 25 Hydroxy (Vit-D Deficiency, Fractures)     Status: Abnormal   Collection Time: 11/29/22 11:59 AM  Result Value Ref Range   Vit D, 25-Hydroxy 29.0 (L) 30.0 - 100.0 ng/mL    Comment: Vitamin D deficiency has been defined by the Institute of Medicine and an Endocrine Society practice guideline as a level of serum 25-OH vitamin D less than 20 ng/mL (1,2). The Endocrine Society went on to further define vitamin D insufficiency as a level between  21 and 29 ng/mL (2). 1. IOM (Institute of Medicine). 2010. Dietary reference    intakes for calcium and D. [place]: The    [employer]. 2. Holick MF, Binkley [place], Bischoff-Ferrari HA, et al.    Evaluation, treatment, and prevention of vitamin D    deficiency: an Endocrine Society clinical practice    guideline. JCEM. 2011 Jul; 96(7):1911-30.   CBC With Differential     Status: None   Collection Time: 11/29/22 11:59 AM  Result Value Ref Range   WBC 9.2 3.4 - 10.8 x10E3/uL   RBC 4.32 4.14 - 5.80 x10E6/uL   Hemoglobin 13.5 13.0 - 17.7 g/dL   Hematocrit 16.1 09.6 - 51.0 %   MCV 94 79 - 97 fL   MCH 31.3 26.6 - 33.0 pg   MCHC 33.1 31.5 - 35.7 g/dL   RDW 04.5 40.9 - 81.1 %   Neutrophils 61 Not Estab. %   Lymphs 29 Not Estab. %   Monocytes 8 Not Estab. %   Eos 1 Not  Estab. %   Basos 1 Not Estab. %   Neutrophils Absolute 5.6 1.4 - 7.0 x10E3/uL   Lymphocytes Absolute 2.7 0.7 - 3.1 x10E3/uL   Monocytes Absolute 0.7 0.1 - 0.9 x10E3/uL   EOS (ABSOLUTE) 0.1 0.0 - 0.4 x10E3/uL   Basophils Absolute 0.1 0.0 - 0.2 x10E3/uL   Immature Granulocytes 0 Not Estab. %   Immature Grans (Abs) 0.0 0.0 - 0.1 x10E3/uL  CMP14+EGFR     Status: None   Collection Time: 11/29/22 11:59 AM  Result Value Ref Range   Glucose 91 70 - 99 mg/dL   BUN 9 6 - 24 mg/dL   Creatinine, Ser 9.14 0.76 - 1.27 mg/dL   eGFR 88 >78 GN/FAO/1.30   BUN/Creatinine Ratio 9 9 - 20   Sodium 141 134 - 144 mmol/L   Potassium 5.1 3.5 - 5.2 mmol/L   Chloride 101 96 - 106 mmol/L   CO2 23 20 - 29 mmol/L   Calcium 9.5 8.7 - 10.2 mg/dL   Total Protein 6.5 6.0 - 8.5 g/dL   Albumin 4.1 3.8 - 4.9 g/dL   Globulin, Total 2.4 1.5 - 4.5 g/dL   Albumin/Globulin Ratio 1.7 1.2 - 2.2   Bilirubin Total 0.4 0.0 - 1.2 mg/dL   Alkaline Phosphatase 110 44 - 121 IU/L   AST 12 0 - 40 IU/L   ALT 6 0 - 44 IU/L  TSH     Status: None   Collection Time: 11/29/22 11:59 AM  Result Value Ref Range   TSH 2.480 0.450 - 4.500 uIU/mL  Hemoglobin A1c     Status: Abnormal   Collection Time: 11/29/22 11:59 AM  Result Value Ref Range   Hgb A1c MFr Bld 5.9 (H) 4.8 - 5.6 %    Comment:          Prediabetes: 5.7 - 6.4          Diabetes: >6.4          Glycemic control for adults with diabetes: <7.0    Est. average glucose Bld gHb Est-mCnc 123 mg/dL  Vitamin Q65     Status: None   Collection Time: 11/29/22 11:59 AM  Result Value Ref Range   Vitamin B-12 305 232 - 1,245 pg/mL       Assessment & Plan:   Problem List Items Addressed This Visit       Active Problems   Unstable angina pectoris [facility])    Patient  is under the care of cardiology.  They are treating this as well as his HFpEF.  Will defer to them for further treatment.       (HFpEF) heart failure with preserved ejection fraction [facility])    Patient is under the  care of cardiology.  He is doing well currently, and has no concerns.  They are treating this.   Will defer to them for further treatment changes.       Back pain - Primary    Sending refills and pain clinic referral for pt. Will continue to bridge therapy until patient is established       Relevant Medications   Oxycodone HCl 10 MG TABS   Other Relevant Orders   Ambulatory referral to Pain Clinic   Benign essential HTN   Hypercholesterolemia    Continue current therapy for lipid control. Will modify as needed based on labwork results.        Smoker   Relevant Orders   CT CHEST LUNG CANCER SCREENING LOW DOSE WO CONTRAST   Nicotine dependence, cigarettes, with withdrawal    CT Low Dose Screening for Lung Ca. Ordered.       Relevant Orders   CT CHEST LUNG CANCER SCREENING LOW DOSE WO CONTRAST    Return in about 1 month (around 01/30/2023) for F/U.   Total time spent: 20 minutes  Miki Kins, FNP  12/30/2022   This document may have been prepared by [facility] Voice Recognition software and as such may include unintentional dictation errors.

## 2022-12-30 NOTE — Assessment & Plan Note (Signed)
Patient is under the care of cardiology.  They are treating this as well as his HFpEF.  Will defer to them for further treatment.

## 2023-01-13 ENCOUNTER — Ambulatory Visit (INDEPENDENT_AMBULATORY_CARE_PROVIDER_SITE_OTHER): Payer: 59

## 2023-01-13 DIAGNOSIS — F17213 Nicotine dependence, cigarettes, with withdrawal: Secondary | ICD-10-CM

## 2023-01-13 DIAGNOSIS — F17200 Nicotine dependence, unspecified, uncomplicated: Secondary | ICD-10-CM

## 2023-01-21 DIAGNOSIS — M5442 Lumbago with sciatica, left side: Secondary | ICD-10-CM | POA: Diagnosis not present

## 2023-01-21 DIAGNOSIS — G8929 Other chronic pain: Secondary | ICD-10-CM | POA: Diagnosis not present

## 2023-01-21 DIAGNOSIS — M129 Arthropathy, unspecified: Secondary | ICD-10-CM | POA: Diagnosis not present

## 2023-01-21 DIAGNOSIS — Z6824 Body mass index (BMI) 24.0-24.9, adult: Secondary | ICD-10-CM | POA: Diagnosis not present

## 2023-01-21 DIAGNOSIS — Z79899 Other long term (current) drug therapy: Secondary | ICD-10-CM | POA: Diagnosis not present

## 2023-01-21 DIAGNOSIS — E559 Vitamin D deficiency, unspecified: Secondary | ICD-10-CM | POA: Diagnosis not present

## 2023-01-21 DIAGNOSIS — M5441 Lumbago with sciatica, right side: Secondary | ICD-10-CM | POA: Diagnosis not present

## 2023-01-27 ENCOUNTER — Encounter: Payer: Self-pay | Admitting: Family

## 2023-01-30 ENCOUNTER — Ambulatory Visit (INDEPENDENT_AMBULATORY_CARE_PROVIDER_SITE_OTHER): Payer: 59 | Admitting: Family

## 2023-01-30 VITALS — BP 115/70 | HR 76 | Resp 97 | Ht 68.0 in | Wt 158.0 lb

## 2023-01-30 DIAGNOSIS — E7800 Pure hypercholesterolemia, unspecified: Secondary | ICD-10-CM | POA: Diagnosis not present

## 2023-01-30 DIAGNOSIS — I10 Essential (primary) hypertension: Secondary | ICD-10-CM | POA: Diagnosis not present

## 2023-01-30 DIAGNOSIS — G8929 Other chronic pain: Secondary | ICD-10-CM

## 2023-01-30 DIAGNOSIS — M5442 Lumbago with sciatica, left side: Secondary | ICD-10-CM

## 2023-01-30 DIAGNOSIS — I200 Unstable angina: Secondary | ICD-10-CM | POA: Diagnosis not present

## 2023-01-30 DIAGNOSIS — M5441 Lumbago with sciatica, right side: Secondary | ICD-10-CM

## 2023-01-30 DIAGNOSIS — F1190 Opioid use, unspecified, uncomplicated: Secondary | ICD-10-CM

## 2023-01-30 DIAGNOSIS — I5032 Chronic diastolic (congestive) heart failure: Secondary | ICD-10-CM | POA: Diagnosis not present

## 2023-01-30 MED ORDER — OXYCODONE HCL 10 MG PO TABS
ORAL_TABLET | ORAL | 0 refills | Status: AC
Start: 2023-01-30 — End: ?

## 2023-01-30 MED ORDER — NALOXONE HCL 4 MG/0.1ML NA LIQD: 1.0000 | Freq: Once | NASAL | 0 refills | Status: DC

## 2023-02-01 ENCOUNTER — Encounter: Payer: Self-pay | Admitting: Family

## 2023-02-03 DIAGNOSIS — E559 Vitamin D deficiency, unspecified: Secondary | ICD-10-CM | POA: Diagnosis not present

## 2023-02-03 DIAGNOSIS — M5441 Lumbago with sciatica, right side: Secondary | ICD-10-CM | POA: Diagnosis not present

## 2023-02-03 DIAGNOSIS — G8929 Other chronic pain: Secondary | ICD-10-CM | POA: Diagnosis not present

## 2023-02-03 DIAGNOSIS — F17200 Nicotine dependence, unspecified, uncomplicated: Secondary | ICD-10-CM | POA: Diagnosis not present

## 2023-02-03 DIAGNOSIS — M5442 Lumbago with sciatica, left side: Secondary | ICD-10-CM | POA: Diagnosis not present

## 2023-02-03 DIAGNOSIS — Z6824 Body mass index (BMI) 24.0-24.9, adult: Secondary | ICD-10-CM | POA: Diagnosis not present

## 2023-02-03 DIAGNOSIS — Z79899 Other long term (current) drug therapy: Secondary | ICD-10-CM | POA: Diagnosis not present

## 2023-02-03 DIAGNOSIS — M539 Dorsopathy, unspecified: Secondary | ICD-10-CM | POA: Diagnosis not present

## 2023-02-04 LAB — TOXASSURE SELECT 16, UR

## 2023-02-05 ENCOUNTER — Encounter: Payer: Self-pay | Admitting: Family

## 2023-02-05 MED ORDER — NALOXONE HCL 4 MG/0.1ML NA LIQD: 1.0000 | NASAL | 4 refills | Status: AC | PRN

## 2023-02-06 NOTE — Progress Notes (Signed)
Established Patient Office Visit  Subjective:  Patient ID: Andrew Rios, male    DOB: 09-Oct-1965  Age: 57 y.o. MRN: [id number]  Chief Complaint  Patient presents with   Follow-up    1 mo F/U    Patient is here today for his 1 month follow up.  He has been feeling well since last appointment.   He does not have additional concerns to discuss today.  He has been to see the pain clinic since his previous appointment here, says that he had a good visit with them and that he is going to continue going.  They did ask him why he was driving so far to get to a pain clinic, and he told them that is where I referred him.   Labs are not due today. He needs refills.   I have reviewed his active problem list, medication list, allergies, notes from last encounter, lab results for his appointment today.    No other concerns at this time.   Past Medical History:  Diagnosis Date   Anginal pain (HCC)    Cervical back pain with evidence of disc disease    GERD (gastroesophageal reflux disease)    History of back injury     Past Surgical History:  Procedure Laterality Date   APPENDECTOMY     LEFT HEART CATH AND CORONARY ANGIOGRAPHY N/A 06/12/2017   Procedure: LEFT HEART CATH AND CORONARY ANGIOGRAPHY;  Surgeon: Lamar Blinks, MD;  Location: [facility] INVASIVE CV LAB;  Service: Cardiovascular;  Laterality: N/A;    Social History   Socioeconomic History   Marital status: Divorced    Spouse name: Not on file   Number of children: Not on file   Years of education: Not on file   Highest education level: Not on file  Occupational History   Not on file  Tobacco Use   Smoking status: Heavy Smoker    Packs/day: 1.00    Years: 35.00    Additional pack years: 0.00    Total pack years: 35.00    Types: Cigarettes   Smokeless tobacco: Never  Substance and Sexual Activity   Alcohol use: No   Drug use: No   Sexual activity: Yes    Birth control/protection: Condom  Other Topics  Concern   Not on file  Social History Narrative   Not on file   Social Determinants of Health   Financial Resource Strain: Not on file  Food Insecurity: Not on file  Transportation Needs: Not on file  Physical Activity: Not on file  Stress: Not on file  Social Connections: Not on file  Intimate Partner Violence: Not on file    Family History  Problem Relation Age of Onset   Hypertension Mother    Diabetes Father     Allergies  Allergen Reactions   Gabapentin Other (See Comments)    Made him nauseous and tried  Made him nauseous and tried  Made him nauseous and tried    Review of Systems  Musculoskeletal:  Positive for back pain.  All other systems reviewed and are negative.      Objective:   BP 115/70   Pulse 76   Resp (!) 97   Ht 5\' 8"  (1.727 m)   Wt 158 lb (71.7 kg)   BMI 24.02 kg/m   Vitals:   01/30/23 1109  BP: 115/70  Pulse: 76  Resp: (!) 97  Height: 5\' 8"  (1.727 m)  Weight: 158 lb (71.7 kg)  BMI (  Calculated): 24.03    Physical Exam Vitals and nursing note reviewed.  Constitutional:      Appearance: Normal appearance. He is normal weight.  Eyes:     Pupils: Pupils are equal, round, and reactive to light.  Cardiovascular:     Rate and Rhythm: Normal rate and regular rhythm.     Pulses: Normal pulses.     Heart sounds: Normal heart sounds.  Pulmonary:     Effort: Pulmonary effort is normal.     Breath sounds: Normal breath sounds.  Musculoskeletal:     Lumbar back: Tenderness present. Positive right straight leg raise test and positive left straight leg raise test.  Neurological:     General: No focal deficit present.     Mental Status: He is alert and oriented to person, place, and time. Mental status is at baseline.  Psychiatric:        Mood and Affect: Mood normal.        Behavior: Behavior normal.        Thought Content: Thought content normal.        Judgment: Judgment normal.      Results for orders placed or performed in  visit on 01/30/23  [employer] Select 16, UR  Result Value Ref Range   Summary Note     Recent Results (from the past 2160 hour(s))  Lipid panel     Status: Abnormal   Collection Time: 11/29/22 11:59 AM  Result Value Ref Range   Cholesterol, Total 137 100 - 199 mg/dL   Triglycerides 84 0 - 149 mg/dL   HDL 36 (L) >16 mg/dL   VLDL Cholesterol Cal 16 5 - 40 mg/dL   LDL Chol Calc (NIH) 85 0 - 99 mg/dL   Chol/HDL Ratio 3.8 0.0 - 5.0 ratio    Comment:                                   T. Chol/HDL Ratio                                             Men  Women                               1/2 Avg.Risk  3.4    3.3                                   Avg.Risk  5.0    4.4                                2X Avg.Risk  9.6    7.1                                3X Avg.Risk 23.4   11.0   VITAMIN D 25 Hydroxy (Vit-D Deficiency, Fractures)     Status: Abnormal   Collection Time: 11/29/22 11:59 AM  Result Value Ref Range   Vit D, 25-Hydroxy 29.0 (L) 30.0 - 100.0 ng/mL    Comment: Vitamin D deficiency has been defined by  the Institute of Medicine and an Endocrine Society practice guideline as a level of serum 25-OH vitamin D less than 20 ng/mL (1,2). The Endocrine Society went on to further define vitamin D insufficiency as a level between 21 and 29 ng/mL (2). 1. IOM (Institute of Medicine). 2010. Dietary reference    intakes for calcium and D. [place]: The    [employer]. 2. Holick MF, Binkley [place], Bischoff-Ferrari HA, et al.    Evaluation, treatment, and prevention of vitamin D    deficiency: an Endocrine Society clinical practice    guideline. JCEM. 2011 Jul; 96(7):1911-30.   CBC With Differential     Status: None   Collection Time: 11/29/22 11:59 AM  Result Value Ref Range   WBC 9.2 3.4 - 10.8 x10E3/uL   RBC 4.32 4.14 - 5.80 x10E6/uL   Hemoglobin 13.5 13.0 - 17.7 g/dL   Hematocrit 16.1 09.6 - 51.0 %   MCV 94 79 - 97 fL   MCH 31.3 26.6 - 33.0 pg   MCHC 33.1 31.5 - 35.7  g/dL   RDW 04.5 40.9 - 81.1 %   Neutrophils 61 Not Estab. %   Lymphs 29 Not Estab. %   Monocytes 8 Not Estab. %   Eos 1 Not Estab. %   Basos 1 Not Estab. %   Neutrophils Absolute 5.6 1.4 - 7.0 x10E3/uL   Lymphocytes Absolute 2.7 0.7 - 3.1 x10E3/uL   Monocytes Absolute 0.7 0.1 - 0.9 x10E3/uL   EOS (ABSOLUTE) 0.1 0.0 - 0.4 x10E3/uL   Basophils Absolute 0.1 0.0 - 0.2 x10E3/uL   Immature Granulocytes 0 Not Estab. %   Immature Grans (Abs) 0.0 0.0 - 0.1 x10E3/uL  CMP14+EGFR     Status: None   Collection Time: 11/29/22 11:59 AM  Result Value Ref Range   Glucose 91 70 - 99 mg/dL   BUN 9 6 - 24 mg/dL   Creatinine, Ser 9.14 0.76 - 1.27 mg/dL   eGFR 88 >78 GN/FAO/1.30   BUN/Creatinine Ratio 9 9 - 20   Sodium 141 134 - 144 mmol/L   Potassium 5.1 3.5 - 5.2 mmol/L   Chloride 101 96 - 106 mmol/L   CO2 23 20 - 29 mmol/L   Calcium 9.5 8.7 - 10.2 mg/dL   Total Protein 6.5 6.0 - 8.5 g/dL   Albumin 4.1 3.8 - 4.9 g/dL   Globulin, Total 2.4 1.5 - 4.5 g/dL   Albumin/Globulin Ratio 1.7 1.2 - 2.2   Bilirubin Total 0.4 0.0 - 1.2 mg/dL   Alkaline Phosphatase 110 44 - 121 IU/L   AST 12 0 - 40 IU/L   ALT 6 0 - 44 IU/L  TSH     Status: None   Collection Time: 11/29/22 11:59 AM  Result Value Ref Range   TSH 2.480 0.450 - 4.500 uIU/mL  Hemoglobin A1c     Status: Abnormal   Collection Time: 11/29/22 11:59 AM  Result Value Ref Range   Hgb A1c MFr Bld 5.9 (H) 4.8 - 5.6 %    Comment:          Prediabetes: 5.7 - 6.4          Diabetes: >6.4          Glycemic control for adults with diabetes: <7.0    Est. average glucose Bld gHb Est-mCnc 123 mg/dL  Vitamin Q65     Status: None   Collection Time: 11/29/22 11:59 AM  Result Value Ref Range   Vitamin B-12 305 232 -  1,245 pg/mL  [employer] Select 16, UR     Status: None   Collection Time: 01/30/23  1:08 PM  Result Value Ref Range   Summary Note     Comment: ==================================================================== [employer] Select 16,  UR ==================================================================== Test                             Result       Flag       Units  Drug Present   Hydromorphone                  >10000                  ng/mg creat    Hydromorphone may be administered as a scheduled prescription    medication; it is also an expected metabolite of hydrocodone.    Oxycodone                      2180                    ng/mg creat   Oxymorphone                    281                     ng/mg creat   Noroxycodone                   2729                    ng/mg creat    Sources of oxycodone include scheduled prescription medications.    Oxymorphone and noroxycodone are expected metabolites of oxycodone.    Oxymorphone is also available as a scheduled prescription medication.  ==================================================================== Test                      Result    Flag   Units       Ref Range   Creatinine              100              mg/dL      >=16 ==================================================================== Declared Medications:  Medication list was not provided. ==================================================================== For clinical consultation, please call [phone]. ====================================================================        Assessment & Plan:   Problem List Items Addressed This Visit       Active Problems   Unstable angina pectoris [facility])    Patient is seen by Cardiology, who manage this condition.  He is well controlled with current therapy.   Will defer to them for further changes to plan of care.       (HFpEF) heart failure with preserved ejection fraction [facility])    Patient is seen by Cardiology, who manage this condition.  He is well controlled with current therapy.   Will defer to them for further changes to plan of care.       Back pain   Relevant Medications   Oxycodone HCl 10 MG TABS   Benign essential HTN     Patient stable.  Well controlled with current therapy.   Continue current meds.       Hypercholesterolemia    Checking labs today.  Continue current therapy for lipid control. Will modify as needed based on labwork results.  Opioid use - Primary    Patient stable.  Well controlled with current therapy.  Sending Narcan RX.   Pt is seeing pain clinic, who will soon be taking over the prescription.  Continue current meds.       Relevant Orders   [facility] Select 16, UR (Completed)    Return in about 1 month (around 03/01/2023) for F/U.   Total time spent: 30 minutes  Miki Kins, FNP  01/30/2023   This document may have been prepared by [facility] Voice Recognition software and as such may include unintentional dictation errors.

## 2023-02-07 ENCOUNTER — Encounter: Payer: Self-pay | Admitting: Family

## 2023-02-07 DIAGNOSIS — F1190 Opioid use, unspecified, uncomplicated: Secondary | ICD-10-CM | POA: Insufficient documentation

## 2023-02-07 NOTE — Assessment & Plan Note (Signed)
Checking labs today.  Continue current therapy for lipid control. Will modify as needed based on labwork results.  

## 2023-02-07 NOTE — Assessment & Plan Note (Addendum)
Patient stable.  Well controlled with current therapy.  Sending Narcan RX.   Pt is seeing pain clinic, who will soon be taking over the prescription.  Continue current meds.

## 2023-02-07 NOTE — Assessment & Plan Note (Signed)
Patient is seen by Cardiology, who manage this condition.  He is well controlled with current therapy.   Will defer to them for further changes to plan of care.  

## 2023-02-07 NOTE — Assessment & Plan Note (Signed)
Patient is seen by Cardiology, who manage this condition.  He is well controlled with current therapy.   Will defer to them for further changes to plan of care.

## 2023-02-07 NOTE — Assessment & Plan Note (Signed)
Patient stable.  Well controlled with current therapy.   Continue current meds.  

## 2023-03-06 DIAGNOSIS — M5442 Lumbago with sciatica, left side: Secondary | ICD-10-CM | POA: Diagnosis not present

## 2023-03-06 DIAGNOSIS — F17200 Nicotine dependence, unspecified, uncomplicated: Secondary | ICD-10-CM | POA: Diagnosis not present

## 2023-03-06 DIAGNOSIS — Z6824 Body mass index (BMI) 24.0-24.9, adult: Secondary | ICD-10-CM | POA: Diagnosis not present

## 2023-03-06 DIAGNOSIS — Z79899 Other long term (current) drug therapy: Secondary | ICD-10-CM | POA: Diagnosis not present

## 2023-03-06 DIAGNOSIS — E559 Vitamin D deficiency, unspecified: Secondary | ICD-10-CM | POA: Diagnosis not present

## 2023-03-06 DIAGNOSIS — M5441 Lumbago with sciatica, right side: Secondary | ICD-10-CM | POA: Diagnosis not present

## 2023-03-06 DIAGNOSIS — M539 Dorsopathy, unspecified: Secondary | ICD-10-CM | POA: Diagnosis not present

## 2023-03-06 DIAGNOSIS — G8929 Other chronic pain: Secondary | ICD-10-CM | POA: Diagnosis not present

## 2023-03-19 DIAGNOSIS — E7800 Pure hypercholesterolemia, unspecified: Secondary | ICD-10-CM | POA: Diagnosis not present

## 2023-03-19 DIAGNOSIS — I10 Essential (primary) hypertension: Secondary | ICD-10-CM | POA: Diagnosis not present

## 2023-03-19 DIAGNOSIS — I2510 Atherosclerotic heart disease of native coronary artery without angina pectoris: Secondary | ICD-10-CM | POA: Diagnosis not present

## 2023-03-19 DIAGNOSIS — F17200 Nicotine dependence, unspecified, uncomplicated: Secondary | ICD-10-CM | POA: Diagnosis not present

## 2023-03-19 DIAGNOSIS — I5032 Chronic diastolic (congestive) heart failure: Secondary | ICD-10-CM | POA: Diagnosis not present

## 2023-04-01 DIAGNOSIS — I5032 Chronic diastolic (congestive) heart failure: Secondary | ICD-10-CM | POA: Diagnosis not present

## 2023-05-02 ENCOUNTER — Ambulatory Visit (INDEPENDENT_AMBULATORY_CARE_PROVIDER_SITE_OTHER): Payer: 59 | Admitting: Family

## 2023-05-02 ENCOUNTER — Encounter: Payer: Self-pay | Admitting: Family

## 2023-05-02 VITALS — BP 120/70 | HR 59 | Ht 68.0 in | Wt 159.8 lb

## 2023-05-02 DIAGNOSIS — I5032 Chronic diastolic (congestive) heart failure: Secondary | ICD-10-CM

## 2023-05-02 DIAGNOSIS — R5383 Other fatigue: Secondary | ICD-10-CM

## 2023-05-02 DIAGNOSIS — E7800 Pure hypercholesterolemia, unspecified: Secondary | ICD-10-CM

## 2023-05-02 DIAGNOSIS — R7303 Prediabetes: Secondary | ICD-10-CM | POA: Diagnosis not present

## 2023-05-02 DIAGNOSIS — I10 Essential (primary) hypertension: Secondary | ICD-10-CM | POA: Diagnosis not present

## 2023-05-02 DIAGNOSIS — I5031 Acute diastolic (congestive) heart failure: Secondary | ICD-10-CM | POA: Diagnosis not present

## 2023-05-02 DIAGNOSIS — E538 Deficiency of other specified B group vitamins: Secondary | ICD-10-CM

## 2023-05-02 DIAGNOSIS — E559 Vitamin D deficiency, unspecified: Secondary | ICD-10-CM

## 2023-05-02 NOTE — Progress Notes (Unsigned)
Established Patient Office Visit  Subjective:  Patient ID: Andrew Rios, male    DOB: 01-13-1966  Age: 57 y.o. MRN: [id number]  Chief Complaint  Patient presents with   Follow-up    3 mo    Patient is here today for his 3 months follow up.  He has been feeling well since last appointment.   He does not have additional concerns to discuss today.  Labs are due today. He needs refills.   I have reviewed his active problem list, medication list, allergies, notes from last encounter, lab results for his appointment today.    No other concerns at this time.   Past Medical History:  Diagnosis Date   Anginal pain (HCC)    Cervical back pain with evidence of disc disease    GERD (gastroesophageal reflux disease)    History of back injury     Past Surgical History:  Procedure Laterality Date   APPENDECTOMY     LEFT HEART CATH AND CORONARY ANGIOGRAPHY N/A 06/12/2017   Procedure: LEFT HEART CATH AND CORONARY ANGIOGRAPHY;  Surgeon: Lamar Blinks, MD;  Location: [facility] INVASIVE CV LAB;  Service: Cardiovascular;  Laterality: N/A;    Social History   Socioeconomic History   Marital status: Divorced    Spouse name: Not on file   Number of children: Not on file   Years of education: Not on file   Highest education level: Not on file  Occupational History   Not on file  Tobacco Use   Smoking status: Heavy Smoker    Current packs/day: 1.00    Average packs/day: 1 pack/day for 35.0 years (35.0 ttl pk-yrs)    Types: Cigarettes   Smokeless tobacco: Never  Substance and Sexual Activity   Alcohol use: No   Drug use: No   Sexual activity: Yes    Birth control/protection: Condom  Other Topics Concern   Not on file  Social History Narrative   Not on file   Social Determinants of Health   Financial Resource Strain: Not on file  Food Insecurity: Not on file  Transportation Needs: Not on file  Physical Activity: Not on file  Stress: Not on file  Social  Connections: Not on file  Intimate Partner Violence: Not on file    Family History  Problem Relation Age of Onset   Hypertension Mother    Diabetes Father     Allergies  Allergen Reactions   Gabapentin Other (See Comments)    Made him nauseous and tried  Made him nauseous and tried  Made him nauseous and tried    Review of Systems  All other systems reviewed and are negative.      Objective:   BP 120/70   Pulse (!) 59   Ht 5\' 8"  (1.727 m)   Wt 159 lb 12.8 oz (72.5 kg)   SpO2 93%   BMI 24.30 kg/m   Vitals:   05/02/23 1349  BP: 120/70  Pulse: (!) 59  Height: 5\' 8"  (1.727 m)  Weight: 159 lb 12.8 oz (72.5 kg)  SpO2: 93%  BMI (Calculated): 24.3    Physical Exam Vitals and nursing note reviewed.  Constitutional:      Appearance: Normal appearance. He is normal weight.  Eyes:     Extraocular Movements: Extraocular movements intact.     Conjunctiva/sclera: Conjunctivae normal.     Pupils: Pupils are equal, round, and reactive to light.  Cardiovascular:     Rate and Rhythm: Normal rate and  regular rhythm.     Pulses: Normal pulses.     Heart sounds: Normal heart sounds.  Pulmonary:     Effort: Pulmonary effort is normal.     Breath sounds: Normal breath sounds.  Neurological:     General: No focal deficit present.     Mental Status: He is alert and oriented to person, place, and time. Mental status is at baseline.  Psychiatric:        Mood and Affect: Mood normal.        Behavior: Behavior normal.        Thought Content: Thought content normal.        Judgment: Judgment normal.      Results for orders placed or performed in visit on 05/02/23  Lipid panel  Result Value Ref Range   Cholesterol, Total 175 100 - 199 mg/dL   Triglycerides 161 (H) 0 - 149 mg/dL   HDL 30 (L) >09 mg/dL   VLDL Cholesterol Cal 32 5 - 40 mg/dL   LDL Chol Calc (NIH) 604 (H) 0 - 99 mg/dL   Chol/HDL Ratio 5.8 (H) 0.0 - 5.0 ratio  VITAMIN D 25 Hydroxy (Vit-D Deficiency,  Fractures)  Result Value Ref Range   Vit D, 25-Hydroxy 33.0 30.0 - 100.0 ng/mL  CMP14+EGFR  Result Value Ref Range   Glucose 89 70 - 99 mg/dL   BUN 9 6 - 24 mg/dL   Creatinine, Ser 5.40 0.76 - 1.27 mg/dL   eGFR 91 >98 JX/BJY/7.82   BUN/Creatinine Ratio 9 9 - 20   Sodium 140 134 - 144 mmol/L   Potassium 4.3 3.5 - 5.2 mmol/L   Chloride 102 96 - 106 mmol/L   CO2 23 20 - 29 mmol/L   Calcium 9.7 8.7 - 10.2 mg/dL   Total Protein 7.1 6.0 - 8.5 g/dL   Albumin 4.6 3.8 - 4.9 g/dL   Globulin, Total 2.5 1.5 - 4.5 g/dL   Bilirubin Total 0.3 0.0 - 1.2 mg/dL   Alkaline Phosphatase 107 44 - 121 IU/L   AST 13 0 - 40 IU/L   ALT 13 0 - 44 IU/L  TSH  Result Value Ref Range   TSH 3.100 0.450 - 4.500 uIU/mL  Hemoglobin A1c  Result Value Ref Range   Hgb A1c MFr Bld 5.8 (H) 4.8 - 5.6 %   Est. average glucose Bld gHb Est-mCnc 120 mg/dL  Vitamin N56  Result Value Ref Range   Vitamin B-12 352 232 - 1,245 pg/mL    Recent Results (from the past 2160 hour(s))  Lipid panel     Status: Abnormal   Collection Time: 05/02/23  2:21 PM  Result Value Ref Range   Cholesterol, Total 175 100 - 199 mg/dL   Triglycerides 213 (H) 0 - 149 mg/dL   HDL 30 (L) >08 mg/dL   VLDL Cholesterol Cal 32 5 - 40 mg/dL   LDL Chol Calc (NIH) 657 (H) 0 - 99 mg/dL   Chol/HDL Ratio 5.8 (H) 0.0 - 5.0 ratio    Comment:                                   T. Chol/HDL Ratio  Men  Women                               1/2 Avg.Risk  3.4    3.3                                   Avg.Risk  5.0    4.4                                2X Avg.Risk  9.6    7.1                                3X Avg.Risk 23.4   11.0   VITAMIN D 25 Hydroxy (Vit-D Deficiency, Fractures)     Status: None   Collection Time: 05/02/23  2:21 PM  Result Value Ref Range   Vit D, 25-Hydroxy 33.0 30.0 - 100.0 ng/mL    Comment: Vitamin D deficiency has been defined by the Institute of Medicine and an Endocrine Society practice  guideline as a level of serum 25-OH vitamin D less than 20 ng/mL (1,2). The Endocrine Society went on to further define vitamin D insufficiency as a level between 21 and 29 ng/mL (2). 1. IOM (Institute of Medicine). 2010. Dietary reference    intakes for calcium and D. [place]: The    [employer]. 2. Holick MF, Binkley [place], Bischoff-Ferrari HA, et al.    Evaluation, treatment, and prevention of vitamin D    deficiency: an Endocrine Society clinical practice    guideline. JCEM. 2011 Jul; 96(7):1911-30.   CMP14+EGFR     Status: None   Collection Time: 05/02/23  2:21 PM  Result Value Ref Range   Glucose 89 70 - 99 mg/dL   BUN 9 6 - 24 mg/dL   Creatinine, Ser 0.35 0.76 - 1.27 mg/dL   eGFR 91 >00 XF/GHW/2.99   BUN/Creatinine Ratio 9 9 - 20   Sodium 140 134 - 144 mmol/L   Potassium 4.3 3.5 - 5.2 mmol/L   Chloride 102 96 - 106 mmol/L   CO2 23 20 - 29 mmol/L   Calcium 9.7 8.7 - 10.2 mg/dL   Total Protein 7.1 6.0 - 8.5 g/dL   Albumin 4.6 3.8 - 4.9 g/dL   Globulin, Total 2.5 1.5 - 4.5 g/dL   Bilirubin Total 0.3 0.0 - 1.2 mg/dL   Alkaline Phosphatase 107 44 - 121 IU/L   AST 13 0 - 40 IU/L   ALT 13 0 - 44 IU/L  TSH     Status: None   Collection Time: 05/02/23  2:21 PM  Result Value Ref Range   TSH 3.100 0.450 - 4.500 uIU/mL  Hemoglobin A1c     Status: Abnormal   Collection Time: 05/02/23  2:21 PM  Result Value Ref Range   Hgb A1c MFr Bld 5.8 (H) 4.8 - 5.6 %    Comment:          Prediabetes: 5.7 - 6.4          Diabetes: >6.4          Glycemic control for adults with diabetes: <7.0    Est. average glucose Bld gHb Est-mCnc 120 mg/dL  Vitamin B71     Status: None  Collection Time: 05/02/23  2:21 PM  Result Value Ref Range   Vitamin B-12 352 232 - 1,245 pg/mL       Assessment & Plan:   Problem List Items Addressed This Visit       Active Problems   (HFpEF) heart failure with preserved ejection fraction (HCC)   Benign essential HTN - Primary    Blood  pressure well controlled with current medications.  Continue current therapy.  Will reassess at follow up.       Relevant Orders   CMP14+EGFR (Completed)   CBC with Differential/Platelet   Hypercholesterolemia    Checking labs today.  Continue current therapy for lipid control. Will modify as needed based on labwork results.       Relevant Orders   Lipid panel (Completed)   CMP14+EGFR (Completed)   CBC with Differential/Platelet   B12 deficiency due to diet    Checking labs today.  Will continue supplements as needed.      Relevant Orders   CMP14+EGFR (Completed)   Vitamin B12 (Completed)   CBC with Differential/Platelet   Vitamin D deficiency, unspecified    Checking labs today.  Will continue supplements as needed.       Relevant Orders   VITAMIN D 25 Hydroxy (Vit-D Deficiency, Fractures) (Completed)   CMP14+EGFR (Completed)   CBC with Differential/Platelet   Other Visit Diagnoses     Essential hypertension, benign       Relevant Orders   CMP14+EGFR (Completed)   CBC with Differential/Platelet   Prediabetes       Relevant Orders   CMP14+EGFR (Completed)   Hemoglobin A1c (Completed)   CBC with Differential/Platelet   Other fatigue       Relevant Orders   CMP14+EGFR (Completed)   TSH (Completed)   CBC with Differential/Platelet       Return in about 3 months (around 08/01/2023) for F/U.   Total time spent: 20 minutes  Miki Kins, FNP  05/02/2023   This document may have been prepared by [facility] Voice Recognition software and as such may include unintentional dictation errors.

## 2023-05-03 LAB — CMP14+EGFR
ALT: 13 [IU]/L (ref 0–44)
AST: 13 [IU]/L (ref 0–40)
Albumin: 4.6 g/dL (ref 3.8–4.9)
Alkaline Phosphatase: 107 [IU]/L (ref 44–121)
BUN/Creatinine Ratio: 9 (ref 9–20)
BUN: 9 mg/dL (ref 6–24)
Bilirubin Total: 0.3 mg/dL (ref 0.0–1.2)
CO2: 23 mmol/L (ref 20–29)
Calcium: 9.7 mg/dL (ref 8.7–10.2)
Chloride: 102 mmol/L (ref 96–106)
Creatinine, Ser: 0.98 mg/dL (ref 0.76–1.27)
Globulin, Total: 2.5 g/dL (ref 1.5–4.5)
Glucose: 89 mg/dL (ref 70–99)
Potassium: 4.3 mmol/L (ref 3.5–5.2)
Sodium: 140 mmol/L (ref 134–144)
Total Protein: 7.1 g/dL (ref 6.0–8.5)
eGFR: 91 mL/min/{1.73_m2} (ref >59)

## 2023-05-03 LAB — LIPID PANEL
Chol/HDL Ratio: 5.8 {ratio} — ABNORMAL HIGH (ref 0.0–5.0)
Cholesterol, Total: 175 mg/dL (ref 100–199)
HDL: 30 mg/dL — ABNORMAL LOW (ref >39)
LDL Chol Calc (NIH): 113 mg/dL — ABNORMAL HIGH (ref 0–99)
Triglycerides: 179 mg/dL — ABNORMAL HIGH (ref 0–149)
VLDL Cholesterol Cal: 32 mg/dL (ref 5–40)

## 2023-05-03 LAB — HEMOGLOBIN A1C
Est. average glucose Bld gHb Est-mCnc: 120 mg/dL
Hgb A1c MFr Bld: 5.8 % — ABNORMAL HIGH (ref 4.8–5.6)

## 2023-05-03 LAB — VITAMIN B12: Vitamin B-12: 352 pg/mL (ref 232–1245)

## 2023-05-03 LAB — TSH: TSH: 3.1 u[IU]/mL (ref 0.450–4.500)

## 2023-05-03 LAB — VITAMIN D 25 HYDROXY (VIT D DEFICIENCY, FRACTURES): Vit D, 25-Hydroxy: 33 ng/mL (ref 30.0–100.0)

## 2023-05-04 ENCOUNTER — Encounter: Payer: Self-pay | Admitting: Family

## 2023-05-04 DIAGNOSIS — E559 Vitamin D deficiency, unspecified: Secondary | ICD-10-CM | POA: Insufficient documentation

## 2023-05-04 DIAGNOSIS — E538 Deficiency of other specified B group vitamins: Secondary | ICD-10-CM | POA: Insufficient documentation

## 2023-05-04 NOTE — Assessment & Plan Note (Signed)
Checking labs today.  Will continue supplements as needed.  

## 2023-05-04 NOTE — Assessment & Plan Note (Signed)
Blood pressure well controlled with current medications.  Continue current therapy.  Will reassess at follow up.  

## 2023-05-04 NOTE — Assessment & Plan Note (Signed)
Checking labs today.  Continue current therapy for lipid control. Will modify as needed based on labwork results.  

## 2023-05-05 DIAGNOSIS — E559 Vitamin D deficiency, unspecified: Secondary | ICD-10-CM | POA: Diagnosis not present

## 2023-05-05 DIAGNOSIS — G894 Chronic pain syndrome: Secondary | ICD-10-CM | POA: Diagnosis not present

## 2023-05-05 DIAGNOSIS — R030 Elevated blood-pressure reading, without diagnosis of hypertension: Secondary | ICD-10-CM | POA: Diagnosis not present

## 2023-05-05 DIAGNOSIS — M5441 Lumbago with sciatica, right side: Secondary | ICD-10-CM | POA: Diagnosis not present

## 2023-05-05 DIAGNOSIS — M539 Dorsopathy, unspecified: Secondary | ICD-10-CM | POA: Diagnosis not present

## 2023-05-05 DIAGNOSIS — G8929 Other chronic pain: Secondary | ICD-10-CM | POA: Diagnosis not present

## 2023-05-05 DIAGNOSIS — Z79899 Other long term (current) drug therapy: Secondary | ICD-10-CM | POA: Diagnosis not present

## 2023-05-05 DIAGNOSIS — F17210 Nicotine dependence, cigarettes, uncomplicated: Secondary | ICD-10-CM | POA: Diagnosis not present

## 2023-05-05 DIAGNOSIS — F17200 Nicotine dependence, unspecified, uncomplicated: Secondary | ICD-10-CM | POA: Diagnosis not present

## 2023-05-05 DIAGNOSIS — M5442 Lumbago with sciatica, left side: Secondary | ICD-10-CM | POA: Diagnosis not present

## 2023-05-06 ENCOUNTER — Other Ambulatory Visit: Payer: Self-pay

## 2023-05-13 DIAGNOSIS — Z79899 Other long term (current) drug therapy: Secondary | ICD-10-CM | POA: Diagnosis not present

## 2023-06-02 DIAGNOSIS — G8929 Other chronic pain: Secondary | ICD-10-CM | POA: Diagnosis not present

## 2023-06-02 DIAGNOSIS — M5442 Lumbago with sciatica, left side: Secondary | ICD-10-CM | POA: Diagnosis not present

## 2023-06-02 DIAGNOSIS — M539 Dorsopathy, unspecified: Secondary | ICD-10-CM | POA: Diagnosis not present

## 2023-06-02 DIAGNOSIS — E559 Vitamin D deficiency, unspecified: Secondary | ICD-10-CM | POA: Diagnosis not present

## 2023-06-02 DIAGNOSIS — Z79899 Other long term (current) drug therapy: Secondary | ICD-10-CM | POA: Diagnosis not present

## 2023-06-02 DIAGNOSIS — M5441 Lumbago with sciatica, right side: Secondary | ICD-10-CM | POA: Diagnosis not present

## 2023-06-02 DIAGNOSIS — R030 Elevated blood-pressure reading, without diagnosis of hypertension: Secondary | ICD-10-CM | POA: Diagnosis not present

## 2023-06-02 DIAGNOSIS — F17200 Nicotine dependence, unspecified, uncomplicated: Secondary | ICD-10-CM | POA: Diagnosis not present

## 2023-06-04 DIAGNOSIS — Z79899 Other long term (current) drug therapy: Secondary | ICD-10-CM | POA: Diagnosis not present

## 2023-06-30 DIAGNOSIS — M539 Dorsopathy, unspecified: Secondary | ICD-10-CM | POA: Diagnosis not present

## 2023-06-30 DIAGNOSIS — M5441 Lumbago with sciatica, right side: Secondary | ICD-10-CM | POA: Diagnosis not present

## 2023-06-30 DIAGNOSIS — F17200 Nicotine dependence, unspecified, uncomplicated: Secondary | ICD-10-CM | POA: Diagnosis not present

## 2023-06-30 DIAGNOSIS — G8929 Other chronic pain: Secondary | ICD-10-CM | POA: Diagnosis not present

## 2023-06-30 DIAGNOSIS — M5442 Lumbago with sciatica, left side: Secondary | ICD-10-CM | POA: Diagnosis not present

## 2023-06-30 DIAGNOSIS — Z79899 Other long term (current) drug therapy: Secondary | ICD-10-CM | POA: Diagnosis not present

## 2023-06-30 DIAGNOSIS — R030 Elevated blood-pressure reading, without diagnosis of hypertension: Secondary | ICD-10-CM | POA: Diagnosis not present

## 2023-06-30 DIAGNOSIS — E559 Vitamin D deficiency, unspecified: Secondary | ICD-10-CM | POA: Diagnosis not present

## 2023-07-07 DIAGNOSIS — Z79899 Other long term (current) drug therapy: Secondary | ICD-10-CM | POA: Diagnosis not present

## 2023-07-22 ENCOUNTER — Ambulatory Visit (INDEPENDENT_AMBULATORY_CARE_PROVIDER_SITE_OTHER): Payer: 59 | Admitting: Family

## 2023-07-22 VITALS — BP 123/76 | HR 80 | Ht 67.0 in | Wt 165.8 lb

## 2023-07-22 DIAGNOSIS — I5032 Chronic diastolic (congestive) heart failure: Secondary | ICD-10-CM

## 2023-07-22 DIAGNOSIS — Z0000 Encounter for general adult medical examination without abnormal findings: Secondary | ICD-10-CM | POA: Diagnosis not present

## 2023-07-31 DIAGNOSIS — M539 Dorsopathy, unspecified: Secondary | ICD-10-CM | POA: Diagnosis not present

## 2023-07-31 DIAGNOSIS — R030 Elevated blood-pressure reading, without diagnosis of hypertension: Secondary | ICD-10-CM | POA: Diagnosis not present

## 2023-07-31 DIAGNOSIS — Z79899 Other long term (current) drug therapy: Secondary | ICD-10-CM | POA: Diagnosis not present

## 2023-07-31 DIAGNOSIS — E559 Vitamin D deficiency, unspecified: Secondary | ICD-10-CM | POA: Diagnosis not present

## 2023-07-31 DIAGNOSIS — F17210 Nicotine dependence, cigarettes, uncomplicated: Secondary | ICD-10-CM | POA: Diagnosis not present

## 2023-07-31 DIAGNOSIS — M5441 Lumbago with sciatica, right side: Secondary | ICD-10-CM | POA: Diagnosis not present

## 2023-07-31 DIAGNOSIS — G8929 Other chronic pain: Secondary | ICD-10-CM | POA: Diagnosis not present

## 2023-07-31 DIAGNOSIS — M5442 Lumbago with sciatica, left side: Secondary | ICD-10-CM | POA: Diagnosis not present

## 2023-08-01 ENCOUNTER — Ambulatory Visit: Payer: 59 | Admitting: Family

## 2023-08-03 ENCOUNTER — Encounter: Payer: Self-pay | Admitting: Family

## 2023-08-03 NOTE — Progress Notes (Signed)
Annual Wellness Visit  Patient: Andrew Rios, Male    DOB: [birth year], 57 y.o.   MRN: [id number] Visit Date: 08/03/2023  Today's Provider: Miki Kins, FNP  Subjective:    Chief Complaint  Patient presents with   Follow-up   Andrew Rios is a 57 y.o. male who presents today for his Annual Wellness Visit.    Past Medical History:  Diagnosis Date   Anginal pain (HCC)    Cervical back pain with evidence of disc disease    GERD (gastroesophageal reflux disease)    History of back injury    Past Surgical History:  Procedure Laterality Date   APPENDECTOMY     LEFT HEART CATH AND CORONARY ANGIOGRAPHY N/A 06/12/2017   Procedure: LEFT HEART CATH AND CORONARY ANGIOGRAPHY;  Surgeon: Lamar Blinks, MD;  Location: [facility];  Service: Cardiovascular;  Laterality: N/A;   Family History  Problem Relation Age of Onset   Hypertension Mother    Diabetes Father    Social History   Socioeconomic History   Marital status: Divorced    Spouse name: Not on file   Number of children: Not on file   Years of education: Not on file   Highest education level: Not on file  Occupational History   Not on file  Tobacco Use   Smoking status: Heavy Smoker    Current packs/day: 1.00    Average packs/day: 1 pack/day for 35.0 years (35.0 ttl pk-yrs)    Types: Cigarettes   Smokeless tobacco: Never  Substance and Sexual Activity   Alcohol use: No   Drug use: No   Sexual activity: Yes    Birth control/protection: Condom  Other Topics Concern   Not on file  Social History Narrative   Not on file   Social Drivers of Health   Financial Resource Strain: Low Risk  (07/22/2023)   Overall Financial Resource Strain (CARDIA)    Difficulty of Paying Living Expenses: Not hard at all  Food Insecurity: No Food Insecurity (07/22/2023)   Hunger Vital Sign    Worried About Running Out of Food in the Last Year: Never true    Ran Out of Food in the Last Year:  Never true  Transportation Needs: No Transportation Needs (07/22/2023)   PRAPARE - Administrator, Civil Service (Medical): No    Lack of Transportation (Non-Medical): No  Physical Activity: Inactive (07/22/2023)   Exercise Vital Sign    Days of Exercise per Week: 0 days    Minutes of Exercise per Session: 0 min  Stress: No Stress Concern Present (07/22/2023)   [employer] of Occupational Health - Occupational Stress Questionnaire    Feeling of Stress : Not at all  Social Connections: Not on file  Intimate Partner Violence: Not At Risk (07/22/2023)   Humiliation, Afraid, Rape, and Kick questionnaire    Fear of Current or Ex-Partner: No    Emotionally Abused: No    Physically Abused: No    Sexually Abused: No    Medications: Outpatient Medications Prior to Visit  Medication Sig   aspirin 81 MG chewable tablet Chew 81 mg by mouth daily.   atorvastatin (LIPITOR) 40 MG tablet Take 40 mg by mouth daily.   Cholecalciferol (VITAMIN D3) 125 MCG (5000 UT) CAPS Take 1 capsule by mouth daily.   diclofenac Sodium (VOLTAREN) 1 % GEL Apply 4 g topically 4 (four) times daily.   ezetimibe (ZETIA) 10 MG tablet Take 10 mg  by mouth daily.   fluticasone (FLONASE) 50 MCG/ACT nasal spray Place 2 sprays into both nostrils daily.   furosemide (LASIX) 20 MG tablet Take 20 mg by mouth daily.   hydrOXYzine (ATARAX) 25 MG tablet Take 25 mg by mouth 4 (four) times daily.   lidocaine (LIDODERM) 5 % Place 1 patch onto the skin daily.   metoprolol succinate (TOPROL-XL) 25 MG 24 hr tablet Take 25 mg by mouth daily.   naloxone (NARCAN) nasal spray 4 mg/0.1 mL Place 1 spray into the nose as needed (For opioid overdose symptoms.). For overdose symptoms.   Oxycodone HCl 10 MG TABS take 1 tablet by mouth three times daily as needed for severe pain.   tiZANidine (ZANAFLEX) 2 MG tablet Take 2 mg by mouth at bedtime.   No facility-administered medications prior to visit.    Allergies  Allergen  Reactions   Gabapentin Other (See Comments)    Made him nauseous and tried  Made him nauseous and tried  Made him nauseous and tried    Patient Care Team: Miki Kins, FNP as PCP - General (Family Medicine)  Review of Systems  Musculoskeletal:  Positive for back pain.  All other systems reviewed and are negative.      Objective:    Vitals: BP 123/76   Pulse 80   Ht 5\' 7"  (1.702 m)   Wt 165 lb 12.8 oz (75.2 kg)   SpO2 97%   BMI 25.97 kg/m    Physical Exam Vitals and nursing note reviewed.  Constitutional:      Appearance: Normal appearance. He is normal weight.  HENT:     Head: Normocephalic.  Eyes:     Extraocular Movements: Extraocular movements intact.     Conjunctiva/sclera: Conjunctivae normal.     Pupils: Pupils are equal, round, and reactive to light.  Cardiovascular:     Rate and Rhythm: Normal rate and regular rhythm.     Pulses: Normal pulses.     Heart sounds: Normal heart sounds.  Pulmonary:     Effort: Pulmonary effort is normal.     Breath sounds: Normal breath sounds.  Musculoskeletal:     Cervical back: Normal range of motion.  Neurological:     General: No focal deficit present.     Mental Status: He is alert.  Psychiatric:        Mood and Affect: Mood normal.        Behavior: Behavior normal.        Thought Content: Thought content normal.        Judgment: Judgment normal.      Most recent functional status assessment:    07/22/2023    3:03 PM  In your present state of health, do you have any difficulty performing the following activities:  Hearing? 0  Vision? 0  Difficulty concentrating or making decisions? 0  Walking or climbing stairs? 1  Dressing or bathing? 0  Doing errands, shopping? 0  Preparing Food and eating ? N  Using the Toilet? N  In the past six months, have you accidently leaked urine? N  Do you have problems with loss of bowel control? N  Managing your Medications? N  Managing your Finances? N   Housekeeping or managing your Housekeeping? N    Most recent fall risk assessment:    07/22/2023    3:03 PM  Fall Risk   Falls in the past year? 1  Number falls in past yr: 1  Injury with Fall? 0  Risk for fall due to : History of fall(s);Impaired balance/gait;Orthopedic patient     Most recent depression screenings:    07/22/2023    3:03 PM  PHQ 2/9 Scores  PHQ - 2 Score 0  PHQ- 9 Score 2    Most recent cognitive screening:    07/22/2023    3:03 PM  6CIT Screen  What Year? 0 points  What month? 0 points  What time? 0 points  Count back from 20 0 points  Months in reverse 0 points  Repeat phrase 0 points  Total Score 0 points    No results found for any visits on 07/22/23.     Assessment & Plan:      Annual wellness visit done today including the all of the following: Reviewed patient's Family Medical History Reviewed and updated list of patient's medical providers Assessment of cognitive impairment was done Assessed patient's functional ability Established a written schedule for health screening services Health Risk Assessent Completed and Reviewed  Exercise Activities and Dietary recommendations  Goals   None     Immunization History  Administered Date(s) Administered   Influenza Split 05/05/2014   Influenza, Seasonal, Injecte, Preservative Fre 05/17/2013   Influenza,inj,Quad PF,6+ Mos 04/23/2018   Influenza-Unspecified 05/17/2013, 05/05/2014, 04/23/2018   Pneumococcal Polysaccharide-23 04/18/2015   Tdap 08/27/2012    Health Maintenance  Topic Date Due   COVID-19 Vaccine (1) Never done   HIV Screening  Never done   Hepatitis C Screening  Never done   Zoster Vaccines- Shingrix (1 of 2) Never done   Colonoscopy  Never done   DTaP/Tdap/Td (2 - Td or Tdap) 08/27/2022   INFLUENZA VACCINE  03/06/2023   Lung Cancer Screening  01/13/2024   Medicare Annual Wellness (AWV)  07/21/2024   HPV VACCINES  Aged Out     Discussed health benefits of  physical activity, and encouraged him to engage in regular exercise appropriate for his age and condition.         Miki Kins, FNP   07/22/2023  This document may have been prepared by Dragon Voice Recognition software and as such may include unintentional dictation errors.

## 2023-08-05 DIAGNOSIS — Z79899 Other long term (current) drug therapy: Secondary | ICD-10-CM | POA: Diagnosis not present

## 2023-09-01 DIAGNOSIS — R030 Elevated blood-pressure reading, without diagnosis of hypertension: Secondary | ICD-10-CM | POA: Diagnosis not present

## 2023-09-01 DIAGNOSIS — Z0000 Encounter for general adult medical examination without abnormal findings: Secondary | ICD-10-CM | POA: Diagnosis not present

## 2023-09-01 DIAGNOSIS — G8929 Other chronic pain: Secondary | ICD-10-CM | POA: Diagnosis not present

## 2023-09-01 DIAGNOSIS — F17210 Nicotine dependence, cigarettes, uncomplicated: Secondary | ICD-10-CM | POA: Diagnosis not present

## 2023-09-01 DIAGNOSIS — Z79899 Other long term (current) drug therapy: Secondary | ICD-10-CM | POA: Diagnosis not present

## 2023-09-01 DIAGNOSIS — E559 Vitamin D deficiency, unspecified: Secondary | ICD-10-CM | POA: Diagnosis not present

## 2023-09-01 DIAGNOSIS — M539 Dorsopathy, unspecified: Secondary | ICD-10-CM | POA: Diagnosis not present

## 2023-09-01 DIAGNOSIS — M5442 Lumbago with sciatica, left side: Secondary | ICD-10-CM | POA: Diagnosis not present

## 2023-09-01 DIAGNOSIS — F17200 Nicotine dependence, unspecified, uncomplicated: Secondary | ICD-10-CM | POA: Diagnosis not present

## 2023-09-01 DIAGNOSIS — M5441 Lumbago with sciatica, right side: Secondary | ICD-10-CM | POA: Diagnosis not present

## 2023-09-03 DIAGNOSIS — Z79899 Other long term (current) drug therapy: Secondary | ICD-10-CM | POA: Diagnosis not present

## 2023-10-02 DIAGNOSIS — G8929 Other chronic pain: Secondary | ICD-10-CM | POA: Diagnosis not present

## 2023-10-02 DIAGNOSIS — R030 Elevated blood-pressure reading, without diagnosis of hypertension: Secondary | ICD-10-CM | POA: Diagnosis not present

## 2023-10-02 DIAGNOSIS — F17210 Nicotine dependence, cigarettes, uncomplicated: Secondary | ICD-10-CM | POA: Diagnosis not present

## 2023-10-02 DIAGNOSIS — Z79899 Other long term (current) drug therapy: Secondary | ICD-10-CM | POA: Diagnosis not present

## 2023-10-02 DIAGNOSIS — M5442 Lumbago with sciatica, left side: Secondary | ICD-10-CM | POA: Diagnosis not present

## 2023-10-02 DIAGNOSIS — F17200 Nicotine dependence, unspecified, uncomplicated: Secondary | ICD-10-CM | POA: Diagnosis not present

## 2023-10-02 DIAGNOSIS — M539 Dorsopathy, unspecified: Secondary | ICD-10-CM | POA: Diagnosis not present

## 2023-10-02 DIAGNOSIS — R21 Rash and other nonspecific skin eruption: Secondary | ICD-10-CM | POA: Diagnosis not present

## 2023-10-02 DIAGNOSIS — E559 Vitamin D deficiency, unspecified: Secondary | ICD-10-CM | POA: Diagnosis not present

## 2023-10-02 DIAGNOSIS — M5441 Lumbago with sciatica, right side: Secondary | ICD-10-CM | POA: Diagnosis not present

## 2023-10-06 DIAGNOSIS — Z79899 Other long term (current) drug therapy: Secondary | ICD-10-CM | POA: Diagnosis not present

## 2023-10-13 DIAGNOSIS — M5441 Lumbago with sciatica, right side: Secondary | ICD-10-CM | POA: Diagnosis not present

## 2023-10-13 DIAGNOSIS — E559 Vitamin D deficiency, unspecified: Secondary | ICD-10-CM | POA: Diagnosis not present

## 2023-10-13 DIAGNOSIS — R030 Elevated blood-pressure reading, without diagnosis of hypertension: Secondary | ICD-10-CM | POA: Diagnosis not present

## 2023-10-13 DIAGNOSIS — G8929 Other chronic pain: Secondary | ICD-10-CM | POA: Diagnosis not present

## 2023-10-13 DIAGNOSIS — M5442 Lumbago with sciatica, left side: Secondary | ICD-10-CM | POA: Diagnosis not present

## 2023-10-13 DIAGNOSIS — M539 Dorsopathy, unspecified: Secondary | ICD-10-CM | POA: Diagnosis not present

## 2023-10-13 DIAGNOSIS — G894 Chronic pain syndrome: Secondary | ICD-10-CM | POA: Diagnosis not present

## 2023-10-13 DIAGNOSIS — F17200 Nicotine dependence, unspecified, uncomplicated: Secondary | ICD-10-CM | POA: Diagnosis not present

## 2023-10-13 DIAGNOSIS — Z79899 Other long term (current) drug therapy: Secondary | ICD-10-CM | POA: Diagnosis not present

## 2023-10-13 DIAGNOSIS — F17210 Nicotine dependence, cigarettes, uncomplicated: Secondary | ICD-10-CM | POA: Diagnosis not present

## 2023-11-13 DIAGNOSIS — M5442 Lumbago with sciatica, left side: Secondary | ICD-10-CM | POA: Diagnosis not present

## 2023-11-13 DIAGNOSIS — G894 Chronic pain syndrome: Secondary | ICD-10-CM | POA: Diagnosis not present

## 2023-11-13 DIAGNOSIS — F17210 Nicotine dependence, cigarettes, uncomplicated: Secondary | ICD-10-CM | POA: Diagnosis not present

## 2023-11-13 DIAGNOSIS — G8929 Other chronic pain: Secondary | ICD-10-CM | POA: Diagnosis not present

## 2023-11-13 DIAGNOSIS — M539 Dorsopathy, unspecified: Secondary | ICD-10-CM | POA: Diagnosis not present

## 2023-11-13 DIAGNOSIS — E559 Vitamin D deficiency, unspecified: Secondary | ICD-10-CM | POA: Diagnosis not present

## 2023-11-13 DIAGNOSIS — Z79899 Other long term (current) drug therapy: Secondary | ICD-10-CM | POA: Diagnosis not present

## 2023-11-13 DIAGNOSIS — R030 Elevated blood-pressure reading, without diagnosis of hypertension: Secondary | ICD-10-CM | POA: Diagnosis not present

## 2023-11-13 DIAGNOSIS — M5441 Lumbago with sciatica, right side: Secondary | ICD-10-CM | POA: Diagnosis not present

## 2023-11-18 DIAGNOSIS — Z79899 Other long term (current) drug therapy: Secondary | ICD-10-CM | POA: Diagnosis not present

## 2023-11-19 ENCOUNTER — Ambulatory Visit: Admitting: Family

## 2023-11-19 ENCOUNTER — Encounter: Payer: Self-pay | Admitting: Family

## 2023-11-19 VITALS — BP 122/72 | HR 83 | Ht 67.5 in | Wt 168.4 lb

## 2023-11-19 DIAGNOSIS — M546 Pain in thoracic spine: Secondary | ICD-10-CM

## 2023-11-19 DIAGNOSIS — M5442 Lumbago with sciatica, left side: Secondary | ICD-10-CM

## 2023-11-19 DIAGNOSIS — E538 Deficiency of other specified B group vitamins: Secondary | ICD-10-CM | POA: Diagnosis not present

## 2023-11-19 DIAGNOSIS — G8929 Other chronic pain: Secondary | ICD-10-CM

## 2023-11-19 DIAGNOSIS — I5031 Acute diastolic (congestive) heart failure: Secondary | ICD-10-CM

## 2023-11-19 DIAGNOSIS — M5441 Lumbago with sciatica, right side: Secondary | ICD-10-CM | POA: Diagnosis not present

## 2023-11-19 DIAGNOSIS — R7303 Prediabetes: Secondary | ICD-10-CM | POA: Diagnosis not present

## 2023-11-19 DIAGNOSIS — E7800 Pure hypercholesterolemia, unspecified: Secondary | ICD-10-CM

## 2023-11-19 DIAGNOSIS — E559 Vitamin D deficiency, unspecified: Secondary | ICD-10-CM

## 2023-11-19 DIAGNOSIS — I10 Essential (primary) hypertension: Secondary | ICD-10-CM

## 2023-11-19 NOTE — Progress Notes (Signed)
 "  Established Patient Office Visit  Subjective:  Patient ID: Andrew Rios, male    DOB: October 19, 1965  Age: 58 y.o. MRN: [id number]  Chief Complaint  Patient presents with   Follow-up    4 mo    HPI  No other concerns at this time.   Past Medical History:  Diagnosis Date   Anginal pain    Cervical back pain with evidence of disc disease    GERD (gastroesophageal reflux disease)    History of back injury     Past Surgical History:  Procedure Laterality Date   APPENDECTOMY     LEFT HEART CATH AND CORONARY ANGIOGRAPHY N/A 06/12/2017   Procedure: LEFT HEART CATH AND CORONARY ANGIOGRAPHY;  Surgeon: Hester Wolm PARAS, MD;  Location: [facility] INV[facility]IVE [facility];  Service: Cardiovascular;  Laterality: N/A;    Social History   Socioeconomic History   Marital status: Divorced    Spouse name: Not on file   Number of children: Not on file   Years of education: Not on file   Highest education level: Not on file  Occupational History   Not on file  Tobacco Use   Smoking status: Heavy Smoker    Current packs/day: 1.00    Average packs/day: 1 pack/day for 35.0 years (35.0 ttl pk-yrs)    Types: Cigarettes   Smokeless tobacco: Never  Substance and Sexual Activity   Alcohol use: No   Drug use: No   Sexual activity: Yes    Birth control/protection: Condom  Other Topics Concern   Not on file  Social History Narrative   Not on file   Social Drivers of Health   Tobacco Use: High Risk (08/27/2024)   Patient History    Smoking Tobacco Use: Heavy Smoker    Smokeless Tobacco Use: Never    Passive Exposure: Not on file  Financial Resource Strain: Low Risk  (03/16/2024)   Received from [facility]   Overall Financial Resource Strain (CARDIA)    Difficulty of Paying Living Expenses: Not hard at all  Food Insecurity: No Food Insecurity (08/30/2024)   Epic    Worried About Radiation Protection Practitioner of Food in the Last Year: Never true    Ran Out of Food in the Last Year: Never  true  Transportation Needs: No Transportation Needs (08/30/2024)   Epic    Lack of Transportation (Medical): No    Lack of Transportation (Non-Medical): No  Physical Activity: Inactive (07/22/2024)   Exercise Vital Sign    Days of Exercise per Week: 0 days    Minutes of Exercise per Session: 0 min  Stress: No Stress Concern Present (07/22/2024)   [employer] of Occupational Health - Occupational Stress Questionnaire    Feeling of Stress: Not at all  Social Connections: Unknown (07/22/2024)   Social Connection and Isolation Panel    Frequency of Communication with Friends and Family: More than three times a week    Frequency of Social Gatherings with Friends and Family: More than three times a week    Attends Religious Services: 1 to 4 times per year    Active Member of [employer] or Organizations: No    Attends Banker Meetings: Never    Marital Status: Patient unable to answer  Intimate Partner Violence: Not At Risk (08/30/2024)   Epic    Fear of Current or Ex-Partner: No    Emotionally Abused: No    Physically Abused: No    Sexually Abused: No  Depression (  PHQ2-9): Low Risk (07/22/2024)   Depression (PHQ2-9)    PHQ-2 Score: 0  Alcohol Screen: Low Risk (07/22/2023)   Alcohol Screen    Last Alcohol Screening Score (AUDIT): 0  Housing: Unknown (08/30/2024)   Epic    Unable to Pay for Housing in the Last Year: No    Number of Times Moved in the Last Year: Not on file    Homeless in the Last Year: No  Utilities: Not At Risk (08/30/2024)   Epic    Threatened with loss of utilities: No  Health Literacy: Adequate Health Literacy (07/22/2024)   B1300 Health Literacy    Frequency of need for help with medical instructions: Never    Family History  Problem Relation Age of Onset   Hypertension Mother    Diabetes Father     Allergies[1]  Review of Systems  All other systems reviewed and are negative.      Objective:   BP 122/72   Pulse 83   Ht 5' 7.5  (1.715 m)   Wt 168 lb 6.4 oz (76.4 kg)   SpO2 94%   BMI 25.99 kg/m   Vitals:   11/19/23 1309  BP: 122/72  Pulse: 83  Height: 5' 7.5 (1.715 m)  Weight: 168 lb 6.4 oz (76.4 kg)  SpO2: 94%  BMI (Calculated): 25.97    Physical Exam Vitals and nursing note reviewed.  Constitutional:      Appearance: Normal appearance. He is normal weight.  Eyes:     Pupils: Pupils are equal, round, and reactive to light.  Cardiovascular:     Rate and Rhythm: Normal rate and regular rhythm.     Pulses: Normal pulses.     Heart sounds: Normal heart sounds.  Pulmonary:     Effort: Pulmonary effort is normal.     Breath sounds: Normal breath sounds.  Neurological:     Mental Status: He is alert.  Psychiatric:        Mood and Affect: Mood normal.        Behavior: Behavior normal.      Results for orders placed or performed in visit on 11/19/23  Lipid panel  Result Value Ref Range   Cholesterol, Total 193 100 - 199 mg/dL   Triglycerides 881 0 - 149 mg/dL   HDL 41 >60 mg/dL   VLDL Cholesterol Cal 21 5 - 40 mg/dL   LDL Chol Calc (NIH) 868 (H) 0 - 99 mg/dL   Chol/HDL Ratio 4.7 0.0 - 5.0 ratio  VITAMIN D  25 Hydroxy (Vit-D Deficiency, Fractures)  Result Value Ref Range   Vit D, 25-Hydroxy 24.8 (L) 30.0 - 100.0 ng/mL  CMP14+EGFR  Result Value Ref Range   Glucose 86 70 - 99 mg/dL   BUN 7 6 - 24 mg/dL   Creatinine, Ser 9.19 0.76 - 1.27 mg/dL   eGFR 894 >40 fO/fpw/8.26   BUN/Creatinine Ratio 9 9 - 20   Sodium 139 134 - 144 mmol/L   Potassium 4.3 3.5 - 5.2 mmol/L   Chloride 102 96 - 106 mmol/L   CO2 25 20 - 29 mmol/L   Calcium  9.4 8.7 - 10.2 mg/dL   Total Protein 6.6 6.0 - 8.5 g/dL   Albumin 4.2 3.8 - 4.9 g/dL   Globulin, Total 2.4 1.5 - 4.5 g/dL   Bilirubin Total 0.4 0.0 - 1.2 mg/dL   Alkaline Phosphatase 104 44 - 121 IU/L   AST 16 0 - 40 IU/L   ALT 14 0 - 44 IU/L  Hemoglobin A1c  Result Value Ref Range   Hgb A1c MFr Bld 5.5 4.8 - 5.6 %   Est. average glucose Bld gHb Est-mCnc 111  mg/dL  Vitamin A87  Result Value Ref Range   Vitamin B-12 287 232 - 1,245 pg/mL  CBC with Diff  Result Value Ref Range   WBC 9.1 3.4 - 10.8 x10E3/uL   RBC 4.32 4.14 - 5.80 x10E6/uL   Hemoglobin 14.2 13.0 - 17.7 g/dL   Hematocrit 57.6 62.4 - 51.0 %   MCV 98 (H) 79 - 97 fL   MCH 32.9 26.6 - 33.0 pg   MCHC 33.6 31.5 - 35.7 g/dL   RDW 86.2 88.3 - 84.5 %   Platelets 293 150 - 450 x10E3/uL   Neutrophils 60 Not Estab. %   Lymphs 29 Not Estab. %   Monocytes 9 Not Estab. %   Eos 1 Not Estab. %   Basos 1 Not Estab. %   Neutrophils Absolute 5.5 1.4 - 7.0 x10E3/uL   Lymphocytes Absolute 2.6 0.7 - 3.1 x10E3/uL   Monocytes Absolute 0.8 0.1 - 0.9 x10E3/uL   EOS (ABSOLUTE) 0.1 0.0 - 0.4 x10E3/uL   Basophils Absolute 0.1 0.0 - 0.2 x10E3/uL   Immature Granulocytes 0 Not Estab. %   Immature Grans (Abs) 0.0 0.0 - 0.1 x10E3/uL    Recent Results (from the past 2160 hours)  CMP14+EGFR     Status: None   Collection Time: 07/22/24 11:54 AM  Result Value Ref Range   Glucose 92 70 - 99 mg/dL   BUN 9 6 - 24 mg/dL   Creatinine, Ser 9.10 0.76 - 1.27 mg/dL   eGFR 898 >40 fO/fpw/8.26   BUN/Creatinine Ratio 10 9 - 20   Sodium 141 134 - 144 mmol/L   Potassium 4.2 3.5 - 5.2 mmol/L   Chloride 103 96 - 106 mmol/L   CO2 25 20 - 29 mmol/L   Calcium  10.1 8.7 - 10.2 mg/dL   Total Protein 6.6 6.0 - 8.5 g/dL   Albumin 4.4 3.8 - 4.9 g/dL   Globulin, Total 2.2 1.5 - 4.5 g/dL   Bilirubin Total 0.3 0.0 - 1.2 mg/dL   Alkaline Phosphatase 110 47 - 123 IU/L   AST 16 0 - 40 IU/L   ALT 18 0 - 44 IU/L  Lipid panel     Status: Abnormal   Collection Time: 07/22/24 11:54 AM  Result Value Ref Range   Cholesterol, Total 169 100 - 199 mg/dL   Triglycerides 874 0 - 149 mg/dL   HDL 36 (L) >60 mg/dL   VLDL Cholesterol Cal 23 5 - 40 mg/dL   LDL Chol Calc (NIH) 889 (H) 0 - 99 mg/dL   Chol/HDL Ratio 4.7 0.0 - 5.0 ratio    Comment:                                   T. Chol/HDL Ratio                                              Men  Women                               1/2 Avg.Risk  3.4    3.3  Avg.Risk  5.0    4.4                                2X Avg.Risk  9.6    7.1                                3X Avg.Risk 23.4   11.0   VITAMIN D  25 Hydroxy (Vit-D Deficiency, Fractures)     Status: None   Collection Time: 07/22/24 11:54 AM  Result Value Ref Range   Vit D, 25-Hydroxy 39.4 30.0 - 100.0 ng/mL    Comment: Vitamin D  deficiency has been defined by the Institute of Medicine and an Endocrine Society practice guideline as a level of serum 25-OH vitamin D  less than 20 ng/mL (1,2). The Endocrine Society went on to further define vitamin D  insufficiency as a level between 21 and 29 ng/mL (2). 1. IOM (Institute of Medicine). 2010. Dietary reference    intakes for calcium  and D. [place]: The    [employer]. 2. Holick MF, Binkley [place], Bischoff-Ferrari HA, et al.    Evaluation, treatment, and prevention of vitamin D     deficiency: an Endocrine Society clinical practice    guideline. JCEM. 2011 Jul; 96(7):1911-30.   Vitamin B12     Status: None   Collection Time: 07/22/24 11:54 AM  Result Value Ref Range   Vitamin B-12 304 232 - 1,245 pg/mL  CBC with Diff     Status: Abnormal   Collection Time: 07/22/24 11:54 AM  Result Value Ref Range   WBC 8.9 3.4 - 10.8 x10E3/uL   RBC 3.90 (L) 4.14 - 5.80 x10E6/uL   Hemoglobin 14.2 13.0 - 17.7 g/dL   Hematocrit 58.4 62.4 - 51.0 %   MCV 106 (H) 79 - 97 fL   MCH 36.4 (H) 26.6 - 33.0 pg   MCHC 34.2 31.5 - 35.7 g/dL   RDW 84.8 88.3 - 84.5 %   Platelets 338 150 - 450 x10E3/uL   Neutrophils 58 Not Estab. %   Lymphs 33 Not Estab. %   Monocytes 6 Not Estab. %   Eos 2 Not Estab. %   Basos 1 Not Estab. %   Neutrophils Absolute 5.2 1.4 - 7.0 x10E3/uL   Lymphocytes Absolute 3.0 0.7 - 3.1 x10E3/uL   Monocytes Absolute 0.5 0.1 - 0.9 x10E3/uL   EOS (ABSOLUTE) 0.1 0.0 - 0.4 x10E3/uL   Basophils Absolute 0.1 0.0 - 0.2 x10E3/uL    Immature Granulocytes 0 Not Estab. %   Immature Grans (Abs) 0.0 0.0 - 0.1 x10E3/uL  Hemoglobin A1c     Status: Abnormal   Collection Time: 07/22/24 11:54 AM  Result Value Ref Range   Hgb A1c MFr Bld 5.7 (H) 4.8 - 5.6 %    Comment:          Prediabetes: 5.7 - 6.4          Diabetes: >6.4          Glycemic control for adults with diabetes: <7.0    Est. average glucose Bld gHb Est-mCnc 117 mg/dL  CBG monitoring, ED     Status: Abnormal   Collection Time: 08/26/24  5:06 PM  Result Value Ref Range   Glucose-Capillary 101 (H) 70 - 99 mg/dL    Comment: Glucose reference range applies only to samples taken after fasting for at least 8  hours.  Protime-INR     Status: None   Collection Time: 08/26/24  5:07 PM  Result Value Ref Range   Prothrombin Time 13.5 11.4 - 15.2 seconds   INR 1.0 0.8 - 1.2    Comment: (NOTE) INR goal varies based on device and disease states. Performed at [facility], [address] [place], [place] [address]   APTT     Status: Abnormal   Collection Time: 08/26/24  5:07 PM  Result Value Ref Range   aPTT 43 (H) 24 - 36 seconds    Comment:        IF BASELINE aPTT IS ELEVATED, SUGGEST PATIENT RISK ASSESSMENT BE USED TO DETERMINE APPROPRIATE ANTICOAGULANT THERAPY. Performed at [facility], [address] [place], [place] [address]   CBC     Status: Abnormal   Collection Time: 08/26/24  5:07 PM  Result Value Ref Range   WBC 9.6 4.0 - 10.5 K/uL   RBC 3.69 (L) 4.22 - 5.81 MIL/uL   Hemoglobin 13.6 13.0 - 17.0 g/dL   HCT 61.3 (L) 60.9 - 47.9 %   MCV 104.6 (H) 80.0 - 100.0 fL   MCH 36.9 (H) 26.0 - 34.0 pg   MCHC 35.2 30.0 - 36.0 g/dL   RDW 85.4 88.4 - 84.4 %   Platelets 316 150 - 400 K/uL   nRBC 0.0 0.0 - 0.2 %    Comment: Performed at [facility], [address] [place], [place] [address]  Differential     Status: None   Collection Time: 08/26/24  5:07 PM  Result Value Ref Range   Neutrophils Relative % 62 %   Neutro Abs  5.9 1.7 - 7.7 K/uL   Lymphocytes Relative 30 %   Lymphs Abs 2.9 0.7 - 4.0 K/uL   Monocytes Relative 6 %   Monocytes Absolute 0.6 0.1 - 1.0 K/uL   Eosinophils Relative 1 %   Eosinophils Absolute 0.1 0.0 - 0.5 K/uL   Basophils Relative 1 %   Basophils Absolute 0.1 0.0 - 0.1 K/uL   Immature Granulocytes 0 %   Abs Immature Granulocytes 0.04 0.00 - 0.07 K/uL    Comment: Performed at [facility], [address] [place], [place] [address]  Comprehensive metabolic panel     Status: Abnormal   Collection Time: 08/26/24  5:07 PM  Result Value Ref Range   Sodium 140 135 - 145 mmol/L   Potassium 3.9 3.5 - 5.1 mmol/L   Chloride 101 98 - 111 mmol/L   CO2 29 22 - 32 mmol/L   Glucose, Bld 100 (H) 70 - 99 mg/dL    Comment: Glucose reference range applies only to samples taken after fasting for at least 8 hours.   BUN 8 6 - 20 mg/dL   Creatinine, Ser 9.12 0.61 - 1.24 mg/dL   Calcium  9.1 8.9 - 10.3 mg/dL   Total Protein 6.9 6.5 - 8.1 g/dL   Albumin 4.4 3.5 - 5.0 g/dL   AST 20 15 - 41 U/L   ALT 28 0 - 44 U/L   Alkaline Phosphatase 120 38 - 126 U/L   Total Bilirubin 0.3 0.0 - 1.2 mg/dL   GFR, Estimated >39 >39 mL/min    Comment: (NOTE) Calculated using the CKD-EPI Creatinine Equation (2021)    Anion gap 11 5 - 15    Comment: Performed at [facility], [address], [place], [place] [address]  Ethanol     Status: None  Collection Time: 08/26/24  5:07 PM  Result Value Ref Range   Alcohol, Ethyl (B) <15 <15 mg/dL    Comment: (NOTE) For medical purposes only. Performed at [facility], [address] [place], [place] [address]   Troponin T, High Sensitivity     Status: None   Collection Time: 08/26/24  5:07 PM  Result Value Ref Range   Troponin T High Sensitivity <6 0 - 19 ng/L    Comment: (NOTE) Biotin concentrations > 1000 ng/mL falsely decrease TnT results.  Serial cardiac troponin measurements are suggested.  Refer to the Links section for chest  pain algorithms and additional  guidance. Performed at [facility], [address] [place], [place] [address]   Troponin T, High Sensitivity     Status: None   Collection Time: 08/26/24  7:36 PM  Result Value Ref Range   Troponin T High Sensitivity <6 0 - 19 ng/L    Comment: (NOTE) Biotin concentrations > 1000 ng/mL falsely decrease TnT results.  Serial cardiac troponin measurements are suggested.  Refer to the Links section for chest pain algorithms and additional  guidance. Performed at [facility], [address] [place], [place] [address]   Lipid panel     Status: Abnormal   Collection Time: 08/27/24  5:54 AM  Result Value Ref Range   Cholesterol 156 0 - 200 mg/dL    Comment:        ATP III CLASSIFICATION:  <200     mg/dL   Desirable  799-760  mg/dL   Borderline High  >=759    mg/dL   High           Triglycerides 106 <150 mg/dL   HDL 32 (L) >59 mg/dL   Total CHOL/HDL Ratio 4.8 RATIO   VLDL 21 0 - 40 mg/dL   LDL Cholesterol 896 (H) 0 - 99 mg/dL    Comment:        Total Cholesterol/HDL:CHD Risk Coronary Heart Disease Risk Table                     Men   Women  1/2 Average Risk   3.4   3.3  Average Risk       5.0   4.4  2 X Average Risk   9.6   7.1  3 X Average Risk  23.4   11.0        Use the calculated Patient Ratio above and the CHD Risk Table to determine the patient's CHD Risk.        ATP III CLASSIFICATION (LDL):  <100     mg/dL   Optimal  899-870  mg/dL   Near or Above                    Optimal  130-159  mg/dL   Borderline  839-810  mg/dL   High  >809     mg/dL   Very High Performed at [facility], [address] [place], [place] [address]   ECHOCARDIOGRAM COMPLETE BUBBLE STUDY     Status: None   Collection Time: 08/27/24 11:52 AM  Result Value Ref Range   Single Plane A4C EF 60.5 %   S' Lateral 2.60 cm   Area-P 1/2 2.87 cm2   Est EF 65 - 70%   Hemoglobin A1c     Status: Abnormal   Collection Time: 08/28/24   5:41 AM  Result Value Ref Range  Hgb A1c MFr Bld 5.7 (H) 4.8 - 5.6 %    Comment: (NOTE) Diagnosis of Diabetes The following HbA1c ranges recommended by the American Diabetes Association (ADA) may be used as an aid in the diagnosis of diabetes mellitus.  Hemoglobin             Suggested A1C NGSP%              Diagnosis  <5.7                   Non Diabetic  5.7-6.4                Pre-Diabetic  >6.4                   Diabetic  <7.0                   Glycemic control for                       adults with diabetes.     Mean Plasma Glucose 116.89 mg/dL    Comment: Performed at [facility], [address] [place], [place] [address]  Lipid panel     Status: Abnormal   Collection Time: 08/28/24  5:41 AM  Result Value Ref Range   Cholesterol 148 0 - 200 mg/dL    Comment:        ATP III CLASSIFICATION:  <200     mg/dL   Desirable  799-760  mg/dL   Borderline High  >=759    mg/dL   High           Triglycerides 116 <150 mg/dL   HDL 28 (L) >59 mg/dL   Total CHOL/HDL Ratio 5.3 RATIO   VLDL 23 0 - 40 mg/dL   LDL Cholesterol 97 0 - 99 mg/dL    Comment:        Total Cholesterol/HDL:CHD Risk Coronary Heart Disease Risk Table                     Men   Women  1/2 Average Risk   3.4   3.3  Average Risk       5.0   4.4  2 X Average Risk   9.6   7.1  3 X Average Risk  23.4   11.0        Use the calculated Patient Ratio above and the CHD Risk Table to determine the patient's CHD Risk.        ATP III CLASSIFICATION (LDL):  <100     mg/dL   Optimal  899-870  mg/dL   Near or Above                    Optimal  130-159  mg/dL   Borderline  839-810  mg/dL   High  >809     mg/dL   Very High Performed at [facility], 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  He is well controlled with current therapy.   Will defer to them for further changes to plan of care.  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  Will defer to them for further treatment changes.  Reassess at follow up.  Hypercholesterolemia Checking labs today.  Continue current therapy for lipid control. Will modify as needed based on labwork results.   -CMP w/eGFR -Lipid Panel  Vitamin D  deficiency, unspecified B12 deficiency due to diet Checking labs today.  Will continue supplements as needed.   - Vitamin D  - Vitamin B12  Prediabetes A1C Continues to be in prediabetic ranges.  Will reassess at follow up after next lab check.  Patient counseled on dietary choices and verbalized understanding.   -CBC w/Diff -CMP w/eGFR -Hemoglobin A1C     Return in about 4 months (around 03/20/2024).   Total time spent: 20 minutes  ALAN CHRISTELLA ARRANT, FNP  11/19/2023   This document may have been prepared by [facility] Voice Recognition software and as such may include unintentional dictation errors.      [1]  Allergies Allergen Reactions   Gabapentin Other (See Comments)    Made him nauseous and tried  Made him nauseous and tried  Made him nauseous and tried   "

## 2023-11-20 ENCOUNTER — Ambulatory Visit: Payer: 59 | Admitting: Family

## 2023-11-20 LAB — LIPID PANEL
Chol/HDL Ratio: 4.7 ratio (ref 0.0–5.0)
Cholesterol, Total: 193 mg/dL (ref 100–199)
HDL: 41 mg/dL (ref >39)
LDL Chol Calc (NIH): 131 mg/dL — ABNORMAL HIGH (ref 0–99)
Triglycerides: 118 mg/dL (ref 0–149)
VLDL Cholesterol Cal: 21 mg/dL (ref 5–40)

## 2023-11-20 LAB — CBC WITH DIFFERENTIAL/PLATELET
Basophils Absolute: 0.1 10*3/uL (ref 0.0–0.2)
Basos: 1 %
EOS (ABSOLUTE): 0.1 10*3/uL (ref 0.0–0.4)
Eos: 1 %
Hematocrit: 42.3 % (ref 37.5–51.0)
Hemoglobin: 14.2 g/dL (ref 13.0–17.7)
Immature Grans (Abs): 0 10*3/uL (ref 0.0–0.1)
Immature Granulocytes: 0 %
Lymphocytes Absolute: 2.6 10*3/uL (ref 0.7–3.1)
Lymphs: 29 %
MCH: 32.9 pg (ref 26.6–33.0)
MCHC: 33.6 g/dL (ref 31.5–35.7)
MCV: 98 fL — ABNORMAL HIGH (ref 79–97)
Monocytes Absolute: 0.8 10*3/uL (ref 0.1–0.9)
Monocytes: 9 %
Neutrophils Absolute: 5.5 10*3/uL (ref 1.4–7.0)
Neutrophils: 60 %
Platelets: 293 10*3/uL (ref 150–450)
RBC: 4.32 x10E6/uL (ref 4.14–5.80)
RDW: 13.7 % (ref 11.6–15.4)
WBC: 9.1 10*3/uL (ref 3.4–10.8)

## 2023-11-20 LAB — CMP14+EGFR
ALT: 14 IU/L (ref 0–44)
AST: 16 IU/L (ref 0–40)
Albumin: 4.2 g/dL (ref 3.8–4.9)
Alkaline Phosphatase: 104 IU/L (ref 44–121)
BUN/Creatinine Ratio: 9 (ref 9–20)
BUN: 7 mg/dL (ref 6–24)
Bilirubin Total: 0.4 mg/dL (ref 0.0–1.2)
CO2: 25 mmol/L (ref 20–29)
Calcium: 9.4 mg/dL (ref 8.7–10.2)
Chloride: 102 mmol/L (ref 96–106)
Creatinine, Ser: 0.8 mg/dL (ref 0.76–1.27)
Globulin, Total: 2.4 g/dL (ref 1.5–4.5)
Glucose: 86 mg/dL (ref 70–99)
Potassium: 4.3 mmol/L (ref 3.5–5.2)
Sodium: 139 mmol/L (ref 134–144)
Total Protein: 6.6 g/dL (ref 6.0–8.5)
eGFR: 105 mL/min/{1.73_m2} (ref >59)

## 2023-11-20 LAB — HEMOGLOBIN A1C
Est. average glucose Bld gHb Est-mCnc: 111 mg/dL
Hgb A1c MFr Bld: 5.5 % (ref 4.8–5.6)

## 2023-11-20 LAB — VITAMIN B12: Vitamin B-12: 287 pg/mL (ref 232–1245)

## 2023-11-20 LAB — VITAMIN D 25 HYDROXY (VIT D DEFICIENCY, FRACTURES): Vit D, 25-Hydroxy: 24.8 ng/mL — ABNORMAL LOW (ref 30.0–100.0)

## 2023-12-15 ENCOUNTER — Other Ambulatory Visit: Payer: Self-pay

## 2023-12-15 MED ORDER — EZETIMIBE 10 MG PO TABS: 10.0000 mg | ORAL_TABLET | Freq: Every day | ORAL | 2 refills | Status: DC

## 2023-12-15 NOTE — Telephone Encounter (Signed)
 I have resent the zetia and taken off the atorvastatin, I will be calling the PM place to get information on what's going on with his appts and pm meds

## 2023-12-18 DIAGNOSIS — M5442 Lumbago with sciatica, left side: Secondary | ICD-10-CM | POA: Diagnosis not present

## 2023-12-18 DIAGNOSIS — F17210 Nicotine dependence, cigarettes, uncomplicated: Secondary | ICD-10-CM | POA: Diagnosis not present

## 2023-12-18 DIAGNOSIS — M549 Dorsalgia, unspecified: Secondary | ICD-10-CM | POA: Diagnosis not present

## 2023-12-18 DIAGNOSIS — G8929 Other chronic pain: Secondary | ICD-10-CM | POA: Diagnosis not present

## 2023-12-18 DIAGNOSIS — M542 Cervicalgia: Secondary | ICD-10-CM | POA: Diagnosis not present

## 2023-12-18 DIAGNOSIS — M5441 Lumbago with sciatica, right side: Secondary | ICD-10-CM | POA: Diagnosis not present

## 2023-12-18 DIAGNOSIS — Z79899 Other long term (current) drug therapy: Secondary | ICD-10-CM | POA: Diagnosis not present

## 2023-12-22 DIAGNOSIS — Z79899 Other long term (current) drug therapy: Secondary | ICD-10-CM | POA: Diagnosis not present

## 2024-01-02 DIAGNOSIS — M129 Arthropathy, unspecified: Secondary | ICD-10-CM | POA: Diagnosis not present

## 2024-01-02 DIAGNOSIS — F17210 Nicotine dependence, cigarettes, uncomplicated: Secondary | ICD-10-CM | POA: Diagnosis not present

## 2024-01-02 DIAGNOSIS — G8929 Other chronic pain: Secondary | ICD-10-CM | POA: Diagnosis not present

## 2024-01-02 DIAGNOSIS — Z79899 Other long term (current) drug therapy: Secondary | ICD-10-CM | POA: Diagnosis not present

## 2024-01-02 DIAGNOSIS — R030 Elevated blood-pressure reading, without diagnosis of hypertension: Secondary | ICD-10-CM | POA: Diagnosis not present

## 2024-01-02 DIAGNOSIS — R5383 Other fatigue: Secondary | ICD-10-CM | POA: Diagnosis not present

## 2024-01-02 DIAGNOSIS — E559 Vitamin D deficiency, unspecified: Secondary | ICD-10-CM | POA: Diagnosis not present

## 2024-01-02 DIAGNOSIS — Z131 Encounter for screening for diabetes mellitus: Secondary | ICD-10-CM | POA: Diagnosis not present

## 2024-01-02 DIAGNOSIS — E7800 Pure hypercholesterolemia, unspecified: Secondary | ICD-10-CM | POA: Diagnosis not present

## 2024-01-02 DIAGNOSIS — M5441 Lumbago with sciatica, right side: Secondary | ICD-10-CM | POA: Diagnosis not present

## 2024-01-02 DIAGNOSIS — M5442 Lumbago with sciatica, left side: Secondary | ICD-10-CM | POA: Diagnosis not present

## 2024-01-06 DIAGNOSIS — Z79899 Other long term (current) drug therapy: Secondary | ICD-10-CM | POA: Diagnosis not present

## 2024-01-19 DIAGNOSIS — Z79899 Other long term (current) drug therapy: Secondary | ICD-10-CM | POA: Diagnosis not present

## 2024-01-19 DIAGNOSIS — M5441 Lumbago with sciatica, right side: Secondary | ICD-10-CM | POA: Diagnosis not present

## 2024-01-19 DIAGNOSIS — M5442 Lumbago with sciatica, left side: Secondary | ICD-10-CM | POA: Diagnosis not present

## 2024-01-19 DIAGNOSIS — G8929 Other chronic pain: Secondary | ICD-10-CM | POA: Diagnosis not present

## 2024-01-19 DIAGNOSIS — F17210 Nicotine dependence, cigarettes, uncomplicated: Secondary | ICD-10-CM | POA: Diagnosis not present

## 2024-01-22 DIAGNOSIS — Z79899 Other long term (current) drug therapy: Secondary | ICD-10-CM | POA: Diagnosis not present

## 2024-02-16 DIAGNOSIS — Z79899 Other long term (current) drug therapy: Secondary | ICD-10-CM | POA: Diagnosis not present

## 2024-02-16 DIAGNOSIS — M5441 Lumbago with sciatica, right side: Secondary | ICD-10-CM | POA: Diagnosis not present

## 2024-02-16 DIAGNOSIS — I5030 Unspecified diastolic (congestive) heart failure: Secondary | ICD-10-CM | POA: Diagnosis not present

## 2024-02-16 DIAGNOSIS — F17210 Nicotine dependence, cigarettes, uncomplicated: Secondary | ICD-10-CM | POA: Diagnosis not present

## 2024-02-16 DIAGNOSIS — M5442 Lumbago with sciatica, left side: Secondary | ICD-10-CM | POA: Diagnosis not present

## 2024-02-16 DIAGNOSIS — R0602 Shortness of breath: Secondary | ICD-10-CM | POA: Diagnosis not present

## 2024-02-16 DIAGNOSIS — G8929 Other chronic pain: Secondary | ICD-10-CM | POA: Diagnosis not present

## 2024-02-20 DIAGNOSIS — Z79899 Other long term (current) drug therapy: Secondary | ICD-10-CM | POA: Diagnosis not present

## 2024-03-16 DIAGNOSIS — M5442 Lumbago with sciatica, left side: Secondary | ICD-10-CM | POA: Diagnosis not present

## 2024-03-16 DIAGNOSIS — I10 Essential (primary) hypertension: Secondary | ICD-10-CM | POA: Diagnosis not present

## 2024-03-16 DIAGNOSIS — E7800 Pure hypercholesterolemia, unspecified: Secondary | ICD-10-CM | POA: Diagnosis not present

## 2024-03-16 DIAGNOSIS — R030 Elevated blood-pressure reading, without diagnosis of hypertension: Secondary | ICD-10-CM | POA: Diagnosis not present

## 2024-03-16 DIAGNOSIS — M5441 Lumbago with sciatica, right side: Secondary | ICD-10-CM | POA: Diagnosis not present

## 2024-03-16 DIAGNOSIS — I5030 Unspecified diastolic (congestive) heart failure: Secondary | ICD-10-CM | POA: Diagnosis not present

## 2024-03-16 DIAGNOSIS — Z79899 Other long term (current) drug therapy: Secondary | ICD-10-CM | POA: Diagnosis not present

## 2024-03-16 DIAGNOSIS — F17210 Nicotine dependence, cigarettes, uncomplicated: Secondary | ICD-10-CM | POA: Diagnosis not present

## 2024-03-16 DIAGNOSIS — Z87891 Personal history of nicotine dependence: Secondary | ICD-10-CM | POA: Diagnosis not present

## 2024-03-16 DIAGNOSIS — I2510 Atherosclerotic heart disease of native coronary artery without angina pectoris: Secondary | ICD-10-CM | POA: Diagnosis not present

## 2024-03-16 DIAGNOSIS — G8929 Other chronic pain: Secondary | ICD-10-CM | POA: Diagnosis not present

## 2024-03-16 DIAGNOSIS — I5032 Chronic diastolic (congestive) heart failure: Secondary | ICD-10-CM | POA: Diagnosis not present

## 2024-03-19 DIAGNOSIS — Z79899 Other long term (current) drug therapy: Secondary | ICD-10-CM | POA: Diagnosis not present

## 2024-03-25 ENCOUNTER — Ambulatory Visit: Admitting: Family

## 2024-04-02 DIAGNOSIS — Z87891 Personal history of nicotine dependence: Secondary | ICD-10-CM | POA: Diagnosis not present

## 2024-04-08 ENCOUNTER — Encounter: Payer: Self-pay | Admitting: Family

## 2024-04-08 ENCOUNTER — Ambulatory Visit (INDEPENDENT_AMBULATORY_CARE_PROVIDER_SITE_OTHER): Admitting: Family

## 2024-04-08 VITALS — BP 130/80 | HR 76 | Ht 67.0 in | Wt 167.0 lb

## 2024-04-08 DIAGNOSIS — M546 Pain in thoracic spine: Secondary | ICD-10-CM | POA: Diagnosis not present

## 2024-04-08 DIAGNOSIS — R7303 Prediabetes: Secondary | ICD-10-CM

## 2024-04-08 DIAGNOSIS — G894 Chronic pain syndrome: Secondary | ICD-10-CM | POA: Diagnosis not present

## 2024-04-08 DIAGNOSIS — I10 Essential (primary) hypertension: Secondary | ICD-10-CM | POA: Diagnosis not present

## 2024-04-08 DIAGNOSIS — M5441 Lumbago with sciatica, right side: Secondary | ICD-10-CM | POA: Diagnosis not present

## 2024-04-08 DIAGNOSIS — E559 Vitamin D deficiency, unspecified: Secondary | ICD-10-CM

## 2024-04-08 DIAGNOSIS — M5442 Lumbago with sciatica, left side: Secondary | ICD-10-CM | POA: Diagnosis not present

## 2024-04-08 DIAGNOSIS — E7800 Pure hypercholesterolemia, unspecified: Secondary | ICD-10-CM | POA: Diagnosis not present

## 2024-04-08 DIAGNOSIS — E538 Deficiency of other specified B group vitamins: Secondary | ICD-10-CM

## 2024-04-08 DIAGNOSIS — Z114 Encounter for screening for human immunodeficiency virus [HIV]: Secondary | ICD-10-CM

## 2024-04-08 DIAGNOSIS — I5031 Acute diastolic (congestive) heart failure: Secondary | ICD-10-CM

## 2024-04-08 DIAGNOSIS — Z1159 Encounter for screening for other viral diseases: Secondary | ICD-10-CM

## 2024-04-08 DIAGNOSIS — G8929 Other chronic pain: Secondary | ICD-10-CM

## 2024-04-08 MED ORDER — VARENICLINE TARTRATE (STARTER) 0.5 MG X 11 & 1 MG X 42 PO TBPK: ORAL_TABLET | ORAL | 0 refills | Status: DC

## 2024-04-08 NOTE — Assessment & Plan Note (Signed)
 Checking labs today.  Will continue supplements as needed.   - Vitamin D  - Vitamin B12 - TSH

## 2024-04-08 NOTE — Progress Notes (Signed)
 Established Patient Office Visit  Subjective:  Patient ID: Andrew Rios, male    DOB: October 13, 1965  Age: 58 y.o. MRN: [id number]  Chief Complaint  Patient presents with   Follow-up    4 month follow up    Patient is here today for his 3 months follow up.  He has been feeling fairly well since last appointment.   He does not have additional concerns to discuss today.  Labs are due today.  He needs refills.   I have reviewed his active problem list, medication list, allergies, notes from last encounter, lab results for his appointment today.      No other concerns at this time.   Past Medical History:  Diagnosis Date   Anginal pain (HCC)    Cervical back pain with evidence of disc disease    GERD (gastroesophageal reflux disease)    History of back injury     Past Surgical History:  Procedure Laterality Date   APPENDECTOMY     LEFT HEART CATH AND CORONARY ANGIOGRAPHY N/A 06/12/2017   Procedure: LEFT HEART CATH AND CORONARY ANGIOGRAPHY;  Surgeon: Hester Wolm PARAS, MD;  Location: [facility] INVASIVE CV LAB;  Service: Cardiovascular;  Laterality: N/A;    Social History   Socioeconomic History   Marital status: Divorced    Spouse name: Not on file   Number of children: Not on file   Years of education: Not on file   Highest education level: Not on file  Occupational History   Not on file  Tobacco Use   Smoking status: Heavy Smoker    Current packs/day: 1.00    Average packs/day: 1 pack/day for 35.0 years (35.0 ttl pk-yrs)    Types: Cigarettes   Smokeless tobacco: Never  Substance and Sexual Activity   Alcohol use: No   Drug use: No   Sexual activity: Yes    Birth control/protection: Condom  Other Topics Concern   Not on file  Social History Narrative   Not on file   Social Drivers of Health   Financial Resource Strain: Low Risk  (03/16/2024)   Received from [facility]   Overall Financial Resource Strain (CARDIA)    Difficulty of  Paying Living Expenses: Not hard at all  Food Insecurity: No Food Insecurity (03/16/2024)   Received from [facility]   Hunger Vital Sign    Within the past 12 months, you worried that your food would run out before you got the money to buy more.: Never true    Within the past 12 months, the food you bought just didn't last and you didn't have money to get more.: Never true  Transportation Needs: No Transportation Needs (03/16/2024)   Received from [facility] - Transportation    In the past 12 months, has lack of transportation kept you from medical appointments or from getting medications?: No    Lack of Transportation (Non-Medical): No  Physical Activity: Inactive (07/22/2023)   Exercise Vital Sign    Days of Exercise per Week: 0 days    Minutes of Exercise per Session: 0 min  Stress: No Stress Concern Present (07/22/2023)   [employer] of Occupational Health - Occupational Stress Questionnaire    Feeling of Stress : Not at all  Social Connections: Not on file  Intimate Partner Violence: Not At Risk (07/22/2023)   Humiliation, Afraid, Rape, and Kick questionnaire    Fear of Current or Ex-Partner: No  Emotionally Abused: No    Physically Abused: No    Sexually Abused: No    Family History  Problem Relation Age of Onset   Hypertension Mother    Diabetes Father     Allergies  Allergen Reactions   Gabapentin Other (See Comments)    Made him nauseous and tried  Made him nauseous and tried  Made him nauseous and tried    Review of Systems  All other systems reviewed and are negative.      Objective:   BP 130/80   Pulse 76   Ht 5' 7 (1.702 m)   Wt 167 lb (75.8 kg)   SpO2 95%   BMI 26.16 kg/m   Vitals:   04/08/24 1021  BP: 130/80  Pulse: 76  Height: 5' 7 (1.702 m)  Weight: 167 lb (75.8 kg)  SpO2: 95%  BMI (Calculated): 26.15    Physical Exam Vitals and nursing note reviewed.  Constitutional:       Appearance: Normal appearance. He is normal weight.  Eyes:     Pupils: Pupils are equal, round, and reactive to light.  Cardiovascular:     Rate and Rhythm: Normal rate and regular rhythm.     Pulses: Normal pulses.     Heart sounds: Normal heart sounds.  Pulmonary:     Effort: Pulmonary effort is normal.     Breath sounds: Normal breath sounds.  Neurological:     Mental Status: He is alert.  Psychiatric:        Mood and Affect: Mood normal.        Behavior: Behavior normal.      No results found for any visits on 04/08/24.  No results found for this or any previous visit (from the past 2160 hours).     Assessment & Plan Acute heart failure with preserved ejection fraction [facility]) Patient is seen by cardiology, who manage this condition.  He is well controlled with current therapy.   Will defer to them for further changes to plan of care.  Chronic bilateral low back pain with bilateral sciatica Chronic thoracic back pain, unspecified back pain laterality Chronic pain syndrome Patient is seen by Pain Management, who manage this condition.  He is well controlled with current therapy.   Will defer to them for further changes to plan of care.  Hypercholesterolemia Checking labs today.  Continue current therapy for lipid control. Will modify as needed based on labwork results.   -CMP w/eGFR -Lipid Panel  Vitamin D  deficiency, unspecified B12 deficiency due to diet Checking labs today.  Will continue supplements as needed.   - Vitamin D  - Vitamin B12 - TSH  Prediabetes A1C Continues to be in prediabetic ranges.  Will reassess at follow up after next lab check.  Patient counseled on dietary choices and verbalized understanding.   -CBC w/Diff -CMP w/eGFR -Hemoglobin A1C  Essential hypertension, benign Blood pressure well controlled with current medications.  Continue current therapy.  Will reassess at follow up.   - CBC w/Diff - CMP w/eGFR  Need for  hepatitis C screening test Screening for HIV without presence of risk factors Test ordered today.  Will call with results when available.     Return in about 3 months (around 07/08/2024) for AWV.   Total time spent: 20 minutes  ALAN CHRISTELLA ARRANT, FNP  04/08/2024   This document may have been prepared by [facility] Voice Recognition software and as such may include unintentional dictation errors.

## 2024-04-08 NOTE — Assessment & Plan Note (Signed)
 Patient is seen by Pain Management, who manage this condition.  He is well controlled with current therapy.   Will defer to them for further changes to plan of care.

## 2024-04-08 NOTE — Assessment & Plan Note (Signed)
 Checking labs today.  Continue current therapy for lipid control. Will modify as needed based on labwork results.   -CMP w/eGFR -Lipid Panel

## 2024-04-08 NOTE — Assessment & Plan Note (Signed)
 Patient is seen by cardiology, who manage this condition.  He is well controlled with current therapy.   Will defer to them for further changes to plan of care.

## 2024-04-09 LAB — LIPID PANEL
Chol/HDL Ratio: 4.9 ratio (ref 0.0–5.0)
Cholesterol, Total: 185 mg/dL (ref 100–199)
HDL: 38 mg/dL — ABNORMAL LOW (ref >39)
LDL Chol Calc (NIH): 127 mg/dL — ABNORMAL HIGH (ref 0–99)
Triglycerides: 108 mg/dL (ref 0–149)
VLDL Cholesterol Cal: 20 mg/dL (ref 5–40)

## 2024-04-09 LAB — CBC WITH DIFFERENTIAL/PLATELET
Basophils Absolute: 0.1 x10E3/uL (ref 0.0–0.2)
Basos: 1 %
EOS (ABSOLUTE): 0.1 x10E3/uL (ref 0.0–0.4)
Eos: 1 %
Hematocrit: 42.2 % (ref 37.5–51.0)
Hemoglobin: 14.3 g/dL (ref 13.0–17.7)
Immature Grans (Abs): 0 x10E3/uL (ref 0.0–0.1)
Immature Granulocytes: 0 %
Lymphocytes Absolute: 2.3 x10E3/uL (ref 0.7–3.1)
Lymphs: 26 %
MCH: 34.9 pg — ABNORMAL HIGH (ref 26.6–33.0)
MCHC: 33.9 g/dL (ref 31.5–35.7)
MCV: 103 fL — ABNORMAL HIGH (ref 79–97)
Monocytes Absolute: 0.7 x10E3/uL (ref 0.1–0.9)
Monocytes: 8 %
Neutrophils Absolute: 5.8 x10E3/uL (ref 1.4–7.0)
Neutrophils: 64 %
Platelets: 303 x10E3/uL (ref 150–450)
RBC: 4.1 x10E6/uL — ABNORMAL LOW (ref 4.14–5.80)
RDW: 14.6 % (ref 11.6–15.4)
WBC: 9 x10E3/uL (ref 3.4–10.8)

## 2024-04-09 LAB — CMP14+EGFR
ALT: 9 IU/L (ref 0–44)
AST: 11 IU/L (ref 0–40)
Albumin: 4.3 g/dL (ref 3.8–4.9)
Alkaline Phosphatase: 117 IU/L (ref 44–121)
BUN/Creatinine Ratio: 10 (ref 9–20)
BUN: 9 mg/dL (ref 6–24)
Bilirubin Total: 0.3 mg/dL (ref 0.0–1.2)
CO2: 24 mmol/L (ref 20–29)
Calcium: 9.8 mg/dL (ref 8.7–10.2)
Chloride: 101 mmol/L (ref 96–106)
Creatinine, Ser: 0.93 mg/dL (ref 0.76–1.27)
Globulin, Total: 2.5 g/dL (ref 1.5–4.5)
Glucose: 77 mg/dL (ref 70–99)
Potassium: 4.4 mmol/L (ref 3.5–5.2)
Sodium: 140 mmol/L (ref 134–144)
Total Protein: 6.8 g/dL (ref 6.0–8.5)
eGFR: 96 mL/min/1.73 (ref >59)

## 2024-04-09 LAB — VITAMIN D 25 HYDROXY (VIT D DEFICIENCY, FRACTURES): Vit D, 25-Hydroxy: 29.4 ng/mL — ABNORMAL LOW (ref 30.0–100.0)

## 2024-04-09 LAB — HCV INTERPRETATION

## 2024-04-09 LAB — HIV ANTIBODY (ROUTINE TESTING W REFLEX): HIV Screen 4th Generation wRfx: NONREACTIVE

## 2024-04-09 LAB — HEMOGLOBIN A1C
Est. average glucose Bld gHb Est-mCnc: 114 mg/dL
Hgb A1c MFr Bld: 5.6 % (ref 4.8–5.6)

## 2024-04-09 LAB — VITAMIN B12: Vitamin B-12: 274 pg/mL (ref 232–1245)

## 2024-04-09 LAB — HCV AB W REFLEX TO QUANT PCR: HCV Ab: NONREACTIVE

## 2024-04-14 DIAGNOSIS — M5441 Lumbago with sciatica, right side: Secondary | ICD-10-CM | POA: Diagnosis not present

## 2024-04-14 DIAGNOSIS — M5442 Lumbago with sciatica, left side: Secondary | ICD-10-CM | POA: Diagnosis not present

## 2024-04-14 DIAGNOSIS — I5032 Chronic diastolic (congestive) heart failure: Secondary | ICD-10-CM | POA: Diagnosis not present

## 2024-04-14 DIAGNOSIS — R030 Elevated blood-pressure reading, without diagnosis of hypertension: Secondary | ICD-10-CM | POA: Diagnosis not present

## 2024-04-14 DIAGNOSIS — Z79899 Other long term (current) drug therapy: Secondary | ICD-10-CM | POA: Diagnosis not present

## 2024-04-14 DIAGNOSIS — F17210 Nicotine dependence, cigarettes, uncomplicated: Secondary | ICD-10-CM | POA: Diagnosis not present

## 2024-04-14 DIAGNOSIS — G8929 Other chronic pain: Secondary | ICD-10-CM | POA: Diagnosis not present

## 2024-04-14 DIAGNOSIS — I10 Essential (primary) hypertension: Secondary | ICD-10-CM | POA: Diagnosis not present

## 2024-04-23 ENCOUNTER — Encounter: Payer: Self-pay | Admitting: Family

## 2024-05-03 ENCOUNTER — Ambulatory Visit: Payer: Self-pay

## 2024-05-13 DIAGNOSIS — M25532 Pain in left wrist: Secondary | ICD-10-CM | POA: Diagnosis not present

## 2024-05-13 DIAGNOSIS — I5032 Chronic diastolic (congestive) heart failure: Secondary | ICD-10-CM | POA: Diagnosis not present

## 2024-05-13 DIAGNOSIS — I10 Essential (primary) hypertension: Secondary | ICD-10-CM | POA: Diagnosis not present

## 2024-05-13 DIAGNOSIS — M5442 Lumbago with sciatica, left side: Secondary | ICD-10-CM | POA: Diagnosis not present

## 2024-05-13 DIAGNOSIS — F17210 Nicotine dependence, cigarettes, uncomplicated: Secondary | ICD-10-CM | POA: Diagnosis not present

## 2024-05-13 DIAGNOSIS — Z79899 Other long term (current) drug therapy: Secondary | ICD-10-CM | POA: Diagnosis not present

## 2024-05-13 DIAGNOSIS — M5441 Lumbago with sciatica, right side: Secondary | ICD-10-CM | POA: Diagnosis not present

## 2024-05-13 DIAGNOSIS — G8929 Other chronic pain: Secondary | ICD-10-CM | POA: Diagnosis not present

## 2024-05-13 DIAGNOSIS — R030 Elevated blood-pressure reading, without diagnosis of hypertension: Secondary | ICD-10-CM | POA: Diagnosis not present

## 2024-05-17 DIAGNOSIS — Z79899 Other long term (current) drug therapy: Secondary | ICD-10-CM | POA: Diagnosis not present

## 2024-05-20 ENCOUNTER — Other Ambulatory Visit: Payer: Self-pay

## 2024-05-20 NOTE — Telephone Encounter (Signed)
 Order has been pended per your request for the patinet

## 2024-05-20 NOTE — Telephone Encounter (Signed)
 Pended rheumatology referral per your verbal

## 2024-07-13 ENCOUNTER — Other Ambulatory Visit: Payer: Self-pay | Admitting: Family

## 2024-07-22 ENCOUNTER — Encounter: Payer: Self-pay | Admitting: Family

## 2024-07-22 ENCOUNTER — Ambulatory Visit: Admitting: Family

## 2024-07-22 VITALS — BP 140/92 | HR 79 | Ht 67.0 in | Wt 168.4 lb

## 2024-07-22 DIAGNOSIS — Z0001 Encounter for general adult medical examination with abnormal findings: Secondary | ICD-10-CM | POA: Diagnosis not present

## 2024-07-22 DIAGNOSIS — I5031 Acute diastolic (congestive) heart failure: Secondary | ICD-10-CM | POA: Diagnosis not present

## 2024-07-22 DIAGNOSIS — R7303 Prediabetes: Secondary | ICD-10-CM | POA: Diagnosis not present

## 2024-07-22 DIAGNOSIS — Z1211 Encounter for screening for malignant neoplasm of colon: Secondary | ICD-10-CM

## 2024-07-22 DIAGNOSIS — E7800 Pure hypercholesterolemia, unspecified: Secondary | ICD-10-CM | POA: Diagnosis not present

## 2024-07-22 DIAGNOSIS — G8929 Other chronic pain: Secondary | ICD-10-CM

## 2024-07-22 DIAGNOSIS — Z0000 Encounter for general adult medical examination without abnormal findings: Secondary | ICD-10-CM

## 2024-07-22 DIAGNOSIS — M546 Pain in thoracic spine: Secondary | ICD-10-CM

## 2024-07-22 DIAGNOSIS — M0579 Rheumatoid arthritis with rheumatoid factor of multiple sites without organ or systems involvement: Secondary | ICD-10-CM | POA: Diagnosis not present

## 2024-07-22 DIAGNOSIS — E538 Deficiency of other specified B group vitamins: Secondary | ICD-10-CM | POA: Diagnosis not present

## 2024-07-22 DIAGNOSIS — I10 Essential (primary) hypertension: Secondary | ICD-10-CM

## 2024-07-22 DIAGNOSIS — R5383 Other fatigue: Secondary | ICD-10-CM | POA: Diagnosis not present

## 2024-07-22 DIAGNOSIS — E559 Vitamin D deficiency, unspecified: Secondary | ICD-10-CM | POA: Diagnosis not present

## 2024-07-22 MED ORDER — LIDOCAINE 5 % EX PTCH: 1.0000 | MEDICATED_PATCH | CUTANEOUS | 5 refills | Status: DC

## 2024-07-22 MED ORDER — HYDROXYZINE HCL 25 MG PO TABS: 25.0000 mg | ORAL_TABLET | Freq: Four times a day (QID) | ORAL | 5 refills | Status: DC

## 2024-07-22 NOTE — Assessment & Plan Note (Signed)
 Checking labs today.  Will continue supplements as needed.   - Vitamin D  - Vitamin B12 - TSH

## 2024-07-22 NOTE — Progress Notes (Signed)
 Annual Wellness Visit  Patient: Andrew Rios, Male    DOB: Feb 03, 1966, 58 y.o.   MRN: [id number] Visit Date: 07/22/2024  Today's Provider: ALAN CHRISTELLA ARRANT, FNP  Subjective:    Chief Complaint  Patient presents with   Annual Exam    AWV, needs COL   Andrew Rios is a 58 y.o. male who presents today for his Annual Wellness Visit.  Past Medical History:  Diagnosis Date   Anginal pain    Cervical back pain with evidence of disc disease    GERD (gastroesophageal reflux disease)    History of back injury    Past Surgical History:  Procedure Laterality Date   APPENDECTOMY     LEFT HEART CATH AND CORONARY ANGIOGRAPHY N/A 06/12/2017   Procedure: LEFT HEART CATH AND CORONARY ANGIOGRAPHY;  Surgeon: Hester Wolm PARAS, MD;  Location: [facility] INVASIVE CV LAB;  Service: Cardiovascular;  Laterality: N/A;   Family History  Problem Relation Age of Onset   Hypertension Mother    Diabetes Father    Social History   Socioeconomic History   Marital status: Divorced    Spouse name: Not on file   Number of children: Not on file   Years of education: Not on file   Highest education level: Not on file  Occupational History   Not on file  Tobacco Use   Smoking status: Heavy Smoker    Current packs/day: 1.00    Average packs/day: 1 pack/day for 35.0 years (35.0 ttl pk-yrs)    Types: Cigarettes   Smokeless tobacco: Never  Substance and Sexual Activity   Alcohol use: No   Drug use: No   Sexual activity: Yes    Birth control/protection: Condom  Other Topics Concern   Not on file  Social History Narrative   Not on file   Social Drivers of Health   Tobacco Use: High Risk (07/22/2024)   Patient History    Smoking Tobacco Use: Heavy Smoker    Smokeless Tobacco Use: Never    Passive Exposure: Not on file  Financial Resource Strain: Low Risk  (03/16/2024)   Received from [facility]   Overall Financial Resource Strain (CARDIA)    Difficulty of  Paying Living Expenses: Not hard at all  Food Insecurity: No Food Insecurity (03/16/2024)   Received from [facility]   Epic    Within the past 12 months, you worried that your food would run out before you got the money to buy more.: Never true    Within the past 12 months, the food you bought just didn't last and you didn't have money to get more.: Never true  Transportation Needs: No Transportation Needs (03/16/2024)   Received from [facility] - Transportation    In the past 12 months, has lack of transportation kept you from medical appointments or from getting medications?: No    Lack of Transportation (Non-Medical): No  Physical Activity: Inactive (07/22/2023)   Exercise Vital Sign    Days of Exercise per Week: 0 days    Minutes of Exercise per Session: 0 min  Stress: No Stress Concern Present (07/22/2023)   [employer] of Occupational Health - Occupational Stress Questionnaire    Feeling of Stress : Not at all  Social Connections: Not on file  Intimate Partner Violence: Not At Risk (07/22/2023)   Humiliation, Afraid, Rape, and Kick questionnaire    Fear of Current or Ex-Partner: No  Emotionally Abused: No    Physically Abused: No    Sexually Abused: No  Depression (PHQ2-9): Low Risk (07/22/2024)   Depression (PHQ2-9)    PHQ-2 Score: 0  Alcohol Screen: Low Risk (07/22/2023)   Alcohol Screen    Last Alcohol Screening Score (AUDIT): 0  Housing: Unknown (03/16/2024)   Received from [facility]   Epic    In the last 12 months, was there a time when you were not able to pay the mortgage or rent on time?: No    Number of Times Moved in the Last Year: Not on file    At any time in the past 12 months, were you homeless or living in a shelter (including now)?: No  Utilities: Not At Risk (03/16/2024)   Received from [facility]   Epic    In the past 12 months has the electric, gas, oil, or water  company threatened to shut off services in your home?: No  Health Literacy: Adequate Health Literacy (07/22/2023)   B1300 Health Literacy    Frequency of need for help with medical instructions: Never    Medications: Show/hide medication list[1]  Allergies[2]  Patient Care Team: Orlean Alan HERO, FNP as PCP - General (Family Medicine)  Review of Systems  Musculoskeletal:  Positive for arthralgias and back pain.  All other systems reviewed and are negative.     Objective:    Vitals: BP (!) 140/92   Pulse 79   Ht 5' 7 (1.702 m)   Wt 168 lb 6.4 oz (76.4 kg)   SpO2 100%   BMI 26.38 kg/m    Physical Exam Vitals and nursing note reviewed.  Constitutional:      Appearance: Normal appearance. He is normal weight.  Eyes:     Pupils: Pupils are equal, round, and reactive to light.  Cardiovascular:     Rate and Rhythm: Normal rate and regular rhythm.     Pulses: Normal pulses.     Heart sounds: Normal heart sounds.  Pulmonary:     Effort: Pulmonary effort is normal.     Breath sounds: Normal breath sounds.  Neurological:     General: No focal deficit present.     Mental Status: He is alert and oriented to person, place, and time. Mental status is at baseline.     Gait: Gait abnormal (limping due to low back/leg pain).  Psychiatric:        Mood and Affect: Mood normal.        Behavior: Behavior normal.      Most recent functional status assessment:    07/22/2024    2:46 PM  In your present state of health, do you have any difficulty performing the following activities:  Hearing? 0  Vision? 0  Difficulty concentrating or making decisions? 0  Walking or climbing stairs? 0  Dressing or bathing? 0  Doing errands, shopping? 0    Most recent fall risk assessment:    07/22/2024    2:45 PM  Fall Risk   Falls in the past year? 0  Number falls in past yr: 0  Injury with Fall? 0     Most recent depression screenings:    07/22/2024    2:44 PM 07/22/2023     3:03 PM  PHQ 2/9 Scores  PHQ - 2 Score 0 0  PHQ- 9 Score 0 2      Data saved with a previous flowsheet row definition    Most recent  cognitive screening:    07/22/2024    2:47 PM  6CIT Screen  What Year? 0 points  What month? 0 points  What time? 0 points  Count back from 20 0 points  Months in reverse 0 points  Repeat phrase 0 points  Total Score 0 points    No results found for any visits on 07/22/24.     Assessment & Plan:      Annual wellness visit done today including the all of the following: Reviewed patient's Family Medical History Reviewed and updated list of patient's medical providers Assessment of cognitive impairment was done Assessed patient's functional ability Established a written schedule for health screening services Health Risk Assessent Completed and Reviewed  Exercise Activities and Dietary recommendations  Goals   None     Immunization History  Administered Date(s) Administered   Influenza Split 05/05/2014   Influenza, Seasonal, Injecte, Preservative Fre 05/17/2013   Influenza,inj,Quad PF,6+ Mos 04/23/2018   Influenza-Unspecified 05/17/2013, 05/05/2014, 04/23/2018   Pneumococcal Polysaccharide-23 04/18/2015   Tdap 08/27/2012    Health Maintenance  Topic Date Due   COVID-19 Vaccine (1) Never done   Hepatitis B Vaccines 19-59 Average Risk (1 of 3 - 19+ 3-dose series) Never done   Zoster Vaccines- Shingrix (1 of 2) Never done   Colonoscopy  Never done   Pneumococcal Vaccine: 50+ Years (2 of 2 - PCV) 04/17/2016   DTaP/Tdap/Td (2 - Td or Tdap) 08/27/2022   Lung Cancer Screening  01/13/2024   Influenza Vaccine  03/05/2024   Medicare Annual Wellness (AWV)  08/31/2024   Hepatitis C Screening  Completed   HIV Screening  Completed   HPV VACCINES  Aged Out   Meningococcal B Vaccine  Aged Out     Discussed health benefits of physical activity, and encouraged him to engage in regular exercise appropriate for his age and condition.     Assessment & Plan Acute heart failure with preserved ejection fraction [facility]) Patient is seen by cardiology, who manage this condition.  He is well controlled with current therapy.   Will defer to them for further changes to plan of care.  Chronic bilateral low back pain with bilateral sciatica Chronic thoracic back pain, unspecified back pain laterality Patient is seen by Pain Management, who manage this condition.  He is well controlled with current therapy.   Will defer to them for further changes to plan of care.  Hypercholesterolemia Checking labs today.  Continue current therapy for lipid control. Will modify as needed based on labwork results.   -CMP w/eGFR -Lipid Panel  Other fatigue Vitamin D  deficiency, unspecified B12 deficiency due to diet Checking labs today.  Will continue supplements as needed.   - Vitamin D  - Vitamin B12 - TSH  Prediabetes A1C Continues to be in prediabetic ranges.  Will reassess at follow up after next lab check.  Patient counseled on dietary choices and verbalized understanding.   -CBC w/Diff -CMP w/eGFR -Hemoglobin A1C  Essential hypertension, benign Blood pressure well controlled with current medications.  Continue current therapy.  Will reassess at follow up.   - CBC w/Diff - CMP w/eGFR  Screening for colon cancer Sent order for cologuard.  Educated patient on proper specimen collection and how to return box.   Will await results and call when available.   Rheumatoid arthritis involving multiple sites with positive rheumatoid factor (HCC) Setting patient up for referral to rheumatology .  Will defer to them for further treatment changes.  Reassess at follow up.  Medicare annual wellness visit, subsequent       ALAN CHRISTELLA ARRANT, FNP   07/22/2024  This document may have been prepared by [employer] Voice Recognition software and as such may include unintentional dictation errors.       [1]  Outpatient  Medications Prior to Visit  Medication Sig   Cholecalciferol (VITAMIN D3) 125 MCG (5000 UT) CAPS Take 1 capsule by mouth daily.   ezetimibe  (ZETIA ) 10 MG tablet TAKE 1 TABLET BY MOUTH ONCE DAILY   fluticasone  (FLONASE ) 50 MCG/ACT nasal spray Place 2 sprays into both nostrils daily.   furosemide (LASIX) 20 MG tablet Take 20 mg by mouth daily.   metoprolol succinate (TOPROL-XL) 25 MG 24 hr tablet Take 25 mg by mouth daily.   Oxycodone  HCl 10 MG TABS take 1 tablet by mouth three times daily as needed for severe pain.   rosuvastatin (CRESTOR) 10 MG tablet Take 10 mg by mouth daily.   tiZANidine (ZANAFLEX) 2 MG tablet Take 2 mg by mouth at bedtime. (Patient taking differently: Take 2 mg by mouth as needed.)   Varenicline  Tartrate, Starter, (CHANTIX  STARTING MONTH PAK) 0.5 MG X 11 & 1 MG X 42 TBPK Take according to package instructions   aspirin  81 MG chewable tablet Chew 81 mg by mouth daily. (Patient not taking: Reported on 07/22/2024)   diclofenac Sodium (VOLTAREN) 1 % GEL Apply 4 g topically 4 (four) times daily. (Patient not taking: Reported on 07/22/2024)   naloxone  (NARCAN ) nasal spray 4 mg/0.1 mL Place 1 spray into the nose as needed (For opioid overdose symptoms.). For overdose symptoms. (Patient not taking: Reported on 07/22/2024)   [DISCONTINUED] hydrOXYzine  (ATARAX ) 25 MG tablet Take 25 mg by mouth 4 (four) times daily. (Patient not taking: Reported on 07/22/2024)   [DISCONTINUED] lidocaine  (LIDODERM ) 5 % Place 1 patch onto the skin daily. (Patient not taking: Reported on 07/22/2024)   No facility-administered medications prior to visit.  [2]  Allergies Allergen Reactions   Gabapentin Other (See Comments)    Made him nauseous and tried  Made him nauseous and tried  Made him nauseous and tried

## 2024-07-22 NOTE — Assessment & Plan Note (Signed)
 Checking labs today.  Continue current therapy for lipid control. Will modify as needed based on labwork results.   -CMP w/eGFR -Lipid Panel

## 2024-07-23 LAB — CMP14+EGFR
ALT: 18 IU/L (ref 0–44)
AST: 16 IU/L (ref 0–40)
Albumin: 4.4 g/dL (ref 3.8–4.9)
Alkaline Phosphatase: 110 IU/L (ref 47–123)
BUN/Creatinine Ratio: 10 (ref 9–20)
BUN: 9 mg/dL (ref 6–24)
Bilirubin Total: 0.3 mg/dL (ref 0.0–1.2)
CO2: 25 mmol/L (ref 20–29)
Calcium: 10.1 mg/dL (ref 8.7–10.2)
Chloride: 103 mmol/L (ref 96–106)
Creatinine, Ser: 0.89 mg/dL (ref 0.76–1.27)
Globulin, Total: 2.2 g/dL (ref 1.5–4.5)
Glucose: 92 mg/dL (ref 70–99)
Potassium: 4.2 mmol/L (ref 3.5–5.2)
Sodium: 141 mmol/L (ref 134–144)
Total Protein: 6.6 g/dL (ref 6.0–8.5)
eGFR: 101 mL/min/1.73

## 2024-07-23 LAB — CBC WITH DIFFERENTIAL/PLATELET
Basophils Absolute: 0.1 x10E3/uL (ref 0.0–0.2)
Basos: 1 %
EOS (ABSOLUTE): 0.1 x10E3/uL (ref 0.0–0.4)
Eos: 2 %
Hematocrit: 41.5 % (ref 37.5–51.0)
Hemoglobin: 14.2 g/dL (ref 13.0–17.7)
Immature Grans (Abs): 0 x10E3/uL (ref 0.0–0.1)
Immature Granulocytes: 0 %
Lymphocytes Absolute: 3 x10E3/uL (ref 0.7–3.1)
Lymphs: 33 %
MCH: 36.4 pg — ABNORMAL HIGH (ref 26.6–33.0)
MCHC: 34.2 g/dL (ref 31.5–35.7)
MCV: 106 fL — ABNORMAL HIGH (ref 79–97)
Monocytes Absolute: 0.5 x10E3/uL (ref 0.1–0.9)
Monocytes: 6 %
Neutrophils Absolute: 5.2 x10E3/uL (ref 1.4–7.0)
Neutrophils: 58 %
Platelets: 338 x10E3/uL (ref 150–450)
RBC: 3.9 x10E6/uL — ABNORMAL LOW (ref 4.14–5.80)
RDW: 15.1 % (ref 11.6–15.4)
WBC: 8.9 x10E3/uL (ref 3.4–10.8)

## 2024-07-23 LAB — VITAMIN D 25 HYDROXY (VIT D DEFICIENCY, FRACTURES): Vit D, 25-Hydroxy: 39.4 ng/mL (ref 30.0–100.0)

## 2024-07-23 LAB — LIPID PANEL
Chol/HDL Ratio: 4.7 ratio (ref 0.0–5.0)
Cholesterol, Total: 169 mg/dL (ref 100–199)
HDL: 36 mg/dL — ABNORMAL LOW
LDL Chol Calc (NIH): 110 mg/dL — ABNORMAL HIGH (ref 0–99)
Triglycerides: 125 mg/dL (ref 0–149)
VLDL Cholesterol Cal: 23 mg/dL (ref 5–40)

## 2024-07-23 LAB — HEMOGLOBIN A1C
Est. average glucose Bld gHb Est-mCnc: 117 mg/dL
Hgb A1c MFr Bld: 5.7 % — ABNORMAL HIGH (ref 4.8–5.6)

## 2024-07-23 LAB — VITAMIN B12: Vitamin B-12: 304 pg/mL (ref 232–1245)

## 2024-07-28 ENCOUNTER — Ambulatory Visit: Payer: Self-pay

## 2024-08-26 ENCOUNTER — Emergency Department

## 2024-08-26 ENCOUNTER — Inpatient Hospital Stay
Admission: EM | Admit: 2024-08-26 | Discharge: 2024-08-28 | DRG: 065 | Disposition: A | Attending: Internal Medicine | Admitting: Internal Medicine

## 2024-08-26 ENCOUNTER — Observation Stay

## 2024-08-26 ENCOUNTER — Other Ambulatory Visit: Payer: Self-pay

## 2024-08-26 DIAGNOSIS — I639 Cerebral infarction, unspecified: Secondary | ICD-10-CM | POA: Diagnosis not present

## 2024-08-26 DIAGNOSIS — G8194 Hemiplegia, unspecified affecting left nondominant side: Secondary | ICD-10-CM | POA: Diagnosis present

## 2024-08-26 DIAGNOSIS — F17210 Nicotine dependence, cigarettes, uncomplicated: Secondary | ICD-10-CM | POA: Diagnosis present

## 2024-08-26 DIAGNOSIS — R079 Chest pain, unspecified: Secondary | ICD-10-CM | POA: Diagnosis present

## 2024-08-26 DIAGNOSIS — R29703 NIHSS score 3: Secondary | ICD-10-CM | POA: Diagnosis present

## 2024-08-26 DIAGNOSIS — D7589 Other specified diseases of blood and blood-forming organs: Secondary | ICD-10-CM | POA: Diagnosis present

## 2024-08-26 DIAGNOSIS — G459 Transient cerebral ischemic attack, unspecified: Secondary | ICD-10-CM | POA: Diagnosis present

## 2024-08-26 DIAGNOSIS — G8929 Other chronic pain: Secondary | ICD-10-CM | POA: Diagnosis present

## 2024-08-26 DIAGNOSIS — Z716 Tobacco abuse counseling: Secondary | ICD-10-CM

## 2024-08-26 DIAGNOSIS — Z7982 Long term (current) use of aspirin: Secondary | ICD-10-CM

## 2024-08-26 DIAGNOSIS — I6350 Cerebral infarction due to unspecified occlusion or stenosis of unspecified cerebral artery: Principal | ICD-10-CM | POA: Diagnosis present

## 2024-08-26 DIAGNOSIS — Z87828 Personal history of other (healed) physical injury and trauma: Secondary | ICD-10-CM

## 2024-08-26 DIAGNOSIS — I10 Essential (primary) hypertension: Secondary | ICD-10-CM

## 2024-08-26 DIAGNOSIS — M5441 Lumbago with sciatica, right side: Secondary | ICD-10-CM | POA: Diagnosis present

## 2024-08-26 DIAGNOSIS — R29810 Facial weakness: Secondary | ICD-10-CM | POA: Diagnosis present

## 2024-08-26 DIAGNOSIS — Z888 Allergy status to other drugs, medicaments and biological substances status: Secondary | ICD-10-CM

## 2024-08-26 DIAGNOSIS — R29701 NIHSS score 1: Secondary | ICD-10-CM

## 2024-08-26 DIAGNOSIS — R4781 Slurred speech: Secondary | ICD-10-CM

## 2024-08-26 DIAGNOSIS — R531 Weakness: Secondary | ICD-10-CM | POA: Diagnosis not present

## 2024-08-26 DIAGNOSIS — R2990 Unspecified symptoms and signs involving the nervous system: Principal | ICD-10-CM

## 2024-08-26 DIAGNOSIS — I110 Hypertensive heart disease with heart failure: Secondary | ICD-10-CM | POA: Diagnosis present

## 2024-08-26 DIAGNOSIS — M5442 Lumbago with sciatica, left side: Secondary | ICD-10-CM | POA: Diagnosis present

## 2024-08-26 DIAGNOSIS — E785 Hyperlipidemia, unspecified: Secondary | ICD-10-CM | POA: Diagnosis present

## 2024-08-26 DIAGNOSIS — Z720 Tobacco use: Secondary | ICD-10-CM

## 2024-08-26 DIAGNOSIS — Z833 Family history of diabetes mellitus: Secondary | ICD-10-CM

## 2024-08-26 DIAGNOSIS — Z8249 Family history of ischemic heart disease and other diseases of the circulatory system: Secondary | ICD-10-CM

## 2024-08-26 DIAGNOSIS — Z79899 Other long term (current) drug therapy: Secondary | ICD-10-CM

## 2024-08-26 LAB — CBC
HCT: 38.6 % — ABNORMAL LOW (ref 39.0–52.0)
Hemoglobin: 13.6 g/dL (ref 13.0–17.0)
MCH: 36.9 pg — ABNORMAL HIGH (ref 26.0–34.0)
MCHC: 35.2 g/dL (ref 30.0–36.0)
MCV: 104.6 fL — ABNORMAL HIGH (ref 80.0–100.0)
Platelets: 316 K/uL (ref 150–400)
RBC: 3.69 MIL/uL — ABNORMAL LOW (ref 4.22–5.81)
RDW: 14.5 % (ref 11.5–15.5)
WBC: 9.6 K/uL (ref 4.0–10.5)
nRBC: 0 % (ref 0.0–0.2)

## 2024-08-26 LAB — COMPREHENSIVE METABOLIC PANEL WITH GFR
ALT: 28 U/L (ref 0–44)
AST: 20 U/L (ref 15–41)
Albumin: 4.4 g/dL (ref 3.5–5.0)
Alkaline Phosphatase: 120 U/L (ref 38–126)
Anion gap: 11 (ref 5–15)
BUN: 8 mg/dL (ref 6–20)
CO2: 29 mmol/L (ref 22–32)
Calcium: 9.1 mg/dL (ref 8.9–10.3)
Chloride: 101 mmol/L (ref 98–111)
Creatinine, Ser: 0.87 mg/dL (ref 0.61–1.24)
GFR, Estimated: >60 mL/min
Glucose, Bld: 100 mg/dL — ABNORMAL HIGH (ref 70–99)
Potassium: 3.9 mmol/L (ref 3.5–5.1)
Sodium: 140 mmol/L (ref 135–145)
Total Bilirubin: 0.3 mg/dL (ref 0.0–1.2)
Total Protein: 6.9 g/dL (ref 6.5–8.1)

## 2024-08-26 LAB — DIFFERENTIAL
Abs Immature Granulocytes: 0.04 K/uL (ref 0.00–0.07)
Basophils Absolute: 0.1 K/uL (ref 0.0–0.1)
Basophils Relative: 1 %
Eosinophils Absolute: 0.1 K/uL (ref 0.0–0.5)
Eosinophils Relative: 1 %
Immature Granulocytes: 0 %
Lymphocytes Relative: 30 %
Lymphs Abs: 2.9 K/uL (ref 0.7–4.0)
Monocytes Absolute: 0.6 K/uL (ref 0.1–1.0)
Monocytes Relative: 6 %
Neutro Abs: 5.9 K/uL (ref 1.7–7.7)
Neutrophils Relative %: 62 %

## 2024-08-26 LAB — APTT: aPTT: 43 s — ABNORMAL HIGH (ref 24–36)

## 2024-08-26 LAB — CBG MONITORING, ED: Glucose-Capillary: 101 mg/dL — ABNORMAL HIGH (ref 70–99)

## 2024-08-26 LAB — ETHANOL: Alcohol, Ethyl (B): <15 mg/dL

## 2024-08-26 LAB — TROPONIN T, HIGH SENSITIVITY
Troponin T High Sensitivity: <6 ng/L (ref 0–19)
Troponin T High Sensitivity: <6 ng/L (ref 0–19)

## 2024-08-26 LAB — PROTIME-INR
INR: 1 (ref 0.8–1.2)
Prothrombin Time: 13.5 s (ref 11.4–15.2)

## 2024-08-26 MED ORDER — OXYCODONE HCL 5 MG PO TABS
10.0000 mg | ORAL_TABLET | Freq: Three times a day (TID) | ORAL | Status: DC | PRN
  Administered 2024-08-27 – 2024-08-28 (×3): 10 mg via ORAL
  Filled 2024-08-26 (×4): qty 2

## 2024-08-26 MED ORDER — ASPIRIN 81 MG PO CHEW
81.0000 mg | CHEWABLE_TABLET | Freq: Every day | ORAL | Status: DC
  Administered 2024-08-27 – 2024-08-28 (×2): 81 mg via ORAL
  Filled 2024-08-26 (×2): qty 1

## 2024-08-26 MED ORDER — EZETIMIBE 10 MG PO TABS
10.0000 mg | ORAL_TABLET | Freq: Every day | ORAL | Status: DC
  Administered 2024-08-27 – 2024-08-28 (×2): 10 mg via ORAL
  Filled 2024-08-26 (×2): qty 1

## 2024-08-26 MED ORDER — ROSUVASTATIN CALCIUM 10 MG PO TABS
10.0000 mg | ORAL_TABLET | Freq: Every day | ORAL | Status: DC
  Administered 2024-08-27 – 2024-08-28 (×2): 10 mg via ORAL
  Filled 2024-08-26 (×2): qty 1

## 2024-08-26 MED ORDER — ASPIRIN 81 MG PO CHEW
324.0000 mg | CHEWABLE_TABLET | Freq: Once | ORAL | Status: AC
  Administered 2024-08-26: 162 mg via ORAL
  Filled 2024-08-26: qty 4

## 2024-08-26 MED ORDER — VITAMIN D3 25 MCG (1000 UNIT) PO TABS
5000.0000 [IU] | ORAL_TABLET | Freq: Every day | ORAL | Status: DC
  Administered 2024-08-27 – 2024-08-28 (×2): 5000 [IU] via ORAL
  Filled 2024-08-26 (×4): qty 5

## 2024-08-26 MED ORDER — TRAZODONE HCL 50 MG PO TABS: 25.0000 mg | ORAL_TABLET | Freq: Every evening | ORAL | Status: DC | PRN

## 2024-08-26 MED ORDER — HYDROXYZINE HCL 25 MG PO TABS
25.0000 mg | ORAL_TABLET | Freq: Four times a day (QID) | ORAL | Status: DC
  Administered 2024-08-27 – 2024-08-28 (×3): 25 mg via ORAL
  Filled 2024-08-26 (×3): qty 1

## 2024-08-26 MED ORDER — SENNOSIDES-DOCUSATE SODIUM 8.6-50 MG PO TABS: 1.0000 | ORAL_TABLET | Freq: Every evening | ORAL | Status: DC | PRN

## 2024-08-26 MED ORDER — CLOPIDOGREL BISULFATE 75 MG PO TABS
300.0000 mg | ORAL_TABLET | Freq: Once | ORAL | Status: AC
  Administered 2024-08-26: 300 mg via ORAL
  Filled 2024-08-26: qty 4

## 2024-08-26 MED ORDER — ACETAMINOPHEN 325 MG RE SUPP: 650.0000 mg | RECTAL | Status: DC | PRN

## 2024-08-26 MED ORDER — STROKE: EARLY STAGES OF RECOVERY BOOK: Freq: Once | Status: AC

## 2024-08-26 MED ORDER — FUROSEMIDE 40 MG PO TABS
20.0000 mg | ORAL_TABLET | Freq: Every day | ORAL | Status: DC
  Administered 2024-08-27 – 2024-08-28 (×2): 20 mg via ORAL
  Filled 2024-08-26 (×2): qty 1

## 2024-08-26 MED ORDER — ACETAMINOPHEN 160 MG/5ML PO SOLN: 650.0000 mg | ORAL | Status: DC | PRN

## 2024-08-26 MED ORDER — FLUTICASONE PROPIONATE 50 MCG/ACT NA SUSP
2.0000 | Freq: Every day | NASAL | Status: DC
  Administered 2024-08-27 – 2024-08-28 (×2): 2 via NASAL
  Filled 2024-08-26: qty 16

## 2024-08-26 MED ORDER — ACETAMINOPHEN 325 MG PO TABS: 650.0000 mg | ORAL_TABLET | ORAL | Status: DC | PRN

## 2024-08-26 MED ORDER — METOPROLOL SUCCINATE ER 25 MG PO TB24
25.0000 mg | ORAL_TABLET | Freq: Every day | ORAL | Status: DC
  Administered 2024-08-27 – 2024-08-28 (×2): 25 mg via ORAL
  Filled 2024-08-26 (×2): qty 1

## 2024-08-26 MED ORDER — ENOXAPARIN SODIUM 40 MG/0.4ML IJ SOSY
40.0000 mg | PREFILLED_SYRINGE | INTRAMUSCULAR | Status: DC
  Administered 2024-08-28: 40 mg via SUBCUTANEOUS
  Filled 2024-08-26 (×2): qty 0.4

## 2024-08-26 MED ORDER — SODIUM CHLORIDE 0.9% FLUSH
3.0000 mL | Freq: Once | INTRAVENOUS | Status: AC
  Administered 2024-08-26: 3 mL via INTRAVENOUS

## 2024-08-26 MED ORDER — ONDANSETRON HCL 4 MG/2ML IJ SOLN: 4.0000 mg | INTRAMUSCULAR | Status: DC | PRN

## 2024-08-26 MED ORDER — IOHEXOL 350 MG/ML SOLN
100.0000 mL | Freq: Once | INTRAVENOUS | Status: AC | PRN
  Administered 2024-08-26: 100 mL via INTRAVENOUS

## 2024-08-26 MED ORDER — SODIUM CHLORIDE 0.9 % IV SOLN: INTRAVENOUS | Status: DC

## 2024-08-26 NOTE — ED Notes (Signed)
 Initiated code stroke to [facility] spoke to [id number]

## 2024-08-26 NOTE — ED Notes (Signed)
Code stroke activated ?

## 2024-08-26 NOTE — Progress Notes (Signed)
 SPIRITUAL CARE AND COUNSELING CONSULT NOTE   VISIT SUMMARY Chaplain responded to a Code Stroke page and found patient's mother present.  Chaplain offered her a compassionate presence.  Mother shared what happened with her son and shared that her faith has given her peace.     SPIRITUAL ENCOUNTER                                                                                                                                                                      Type of Visit: Initial Care provided to:: Family Conversation partners present during encounter: Nurse Referral source: Code page Reason for visit: Code OnCall Visit: Yes   SPIRITUAL FRAMEWORK  Presenting Themes: Values and beliefs, Community and relationships, Impactful experiences and emotions Community/Connection: Family Patient Stress Factors: None identified Family Stress Factors: None identified   GOALS       INTERVENTIONS        INTERVENTION OUTCOMES      SPIRITUAL CARE PLAN        If immediate needs arise, please contact [facility] 24 hour on call [phone].   Hart Moats, Chaplain  08/26/2024 5:55 PM

## 2024-08-26 NOTE — ED Provider Notes (Signed)
 "  [facility] Provider Note    Event Date/Time   First MD Initiated Contact with Patient 08/26/24 1703     (approximate)   History   Numbness (/)   HPI  Andrew Rios is a 59 y.o. male with history of heart failure, chronic pain of his back, hyperlipidemia, hypertension, smoking history who comes in with concerns for sudden onset of left-sided weakness, numbness.  Patient reports he was working on the car when he had sudden onset of worsening low back pain from his normal baseline back pain.  He reports he developed left leg weakness and numbness which then progressed into left arm and left face.  Did report having some slurred speech.  Speech seems to be back at baseline and he does report that symptoms are improving but still feels weak on his left side.  He denies any chest pain, abdominal pain.   Physical Exam   Triage Vital Signs: ED Triage Vitals  Encounter Vitals Group     BP      Girls Systolic BP Percentile      Girls Diastolic BP Percentile      Boys Systolic BP Percentile      Boys Diastolic BP Percentile      Pulse      Resp      Temp      Temp src      SpO2      Weight      Height      Head Circumference      Peak Flow      Pain Score      Pain Loc      Pain Education      Exclude from Growth Chart     Most recent vital signs: Vitals:   08/26/24 1705  BP: (!) 141/92  Pulse: 72  Resp: 18  Temp: 98 F (36.7 C)  SpO2: 98%     General: Awake, no distress.  CV:  Good peripheral perfusion.  Resp:  Normal effort.  Abd:  No distention.  Soft and nontender Other:  Slight droop noted, slight weakness noted on the left side.  Sensation changes.   ED Results / Procedures / Treatments   Labs (all labs ordered are listed, but only abnormal results are displayed) Labs Reviewed  CBG MONITORING, ED - Abnormal; Notable for the following components:      Result Value   Glucose-Capillary 101 (*)    All other components  within normal limits  PROTIME-INR  APTT  CBC  DIFFERENTIAL  COMPREHENSIVE METABOLIC PANEL WITH GFR  ETHANOL  I-STAT CREATININE, ED  TROPONIN T, HIGH SENSITIVITY     EKG  My interpretation of EKG:  Normal sinus rate of 77 without any ST elevation or T wave versions, normal intervals  RADIOLOGY I have reviewed the ct personally and interpreted no evidence of intracranial hemorrhage   PROCEDURES:  Critical Care performed: Yes, see critical care procedure note(s)  .1-3 Lead EKG Interpretation  Performed by: Ernest Ronal BRAVO, MD Authorized by: Ernest Ronal BRAVO, MD     Interpretation: normal     ECG rate:  70   ECG rate assessment: normal     Rhythm: sinus rhythm     Ectopy: none     Conduction: normal   .Critical Care  Performed by: Ernest Ronal BRAVO, MD Authorized by: Ernest Ronal BRAVO, MD   Critical care provider statement:    Critical care time (minutes):  30  Critical care was necessary to treat or prevent imminent or life-threatening deterioration of the following conditions:  CNS failure or compromise   Critical care was time spent personally by me on the following activities:  Development of treatment plan with patient or surrogate, discussions with consultants, evaluation of patient's response to treatment, examination of patient, ordering and review of laboratory studies, ordering and review of radiographic studies, ordering and performing treatments and interventions, pulse oximetry, re-evaluation of patient's condition and review of old charts    MEDICATIONS ORDERED IN ED: Medications  sodium chloride  flush (NS) 0.9 % injection 3 mL (has no administration in time range)     IMPRESSION / MDM / ASSESSMENT AND PLAN / ED COURSE  I reviewed the triage vital signs and the nursing notes.   Patient's presentation is most consistent with acute presentation with potential threat to life or bodily function.   Stroke code called from triage given last known well within stroke  code window.  Given the sudden onset of worsening back pain CT dissection also ordered.  Discussed the case with Dr. Germaine and did confirm with her that I would want the CT dissection to be read by radiologist prior to initiating TNK if she felt that TNK was warranted.  Were also going to wait on doing the dissection scan until she evaluates patient to see if a CT angio head and neck are necessary.  Patient denies any chest pain but will get EKG, troponins, workup to evaluate for other causes.  CT head CTA and CT chest were all negative.  Discussed with Dr. Germaine who recommended monitoring in the emergency room until 845 until he was out of the TNK window.  Also recommended giving Plavix , aspirin  load.  Patient already took 2 aspirins prior to coming into is only given additional 2 aspirins here.  2. Secretions in the right posterolateral trachea just below the thoracic inlet. Recommend correlation for symptoms of aspiration. 2. Emphysema (ICD10-J43.9). Given the presence of pulmonary emphysema, an independent risk factor for lung cancer, consider evaluating the patient for a low-dose CT lung cancer screening program.    Incidental findings were discussed with patient-patient does report an episode of vomiting but does not really report feeling like he aspirated anything.  He states that the vomiting happening while he was being driven here and he thinks it was just from some carsickness.  7:06 PM repeat assessment patient's neuroexam is reassuring he has resolution of his speech and the resolution of the left-sided weakness.  Discussed with patient continue to monitor into 845 and if symptoms are still improved then we will admit to the hospital team for TIA, stroke workup.  Patient expressed understanding but comfortable with this plan.  Given he did report the back pain I did consider the possibility of cord compression but patient has had resolution of symptoms that it also he had reported  the low back pain and arm and face tingling which would not be consistent with a lumbar cord compression.  He has no new numbness in his rectum area, no urinary or rectal incontinence.  Patient just had a large amount of urine he was able to put out.  So this seems less likely.  9:00 PM reevaluated patient and continues to be neuro intact therefore we will discuss hospital team for admission  The patient is on the cardiac monitor to evaluate for evidence of arrhythmia and/or significant heart rate changes.      FINAL CLINICAL IMPRESSION(S) / ED  DIAGNOSES   Final diagnoses:  Stroke-like symptoms     Rx / DC Orders   ED Discharge Orders     None        Note:  This document was prepared using Dragon voice recognition software and may include unintentional dictation errors.   Ernest Ronal BRAVO, MD 08/26/24 2100  "

## 2024-08-26 NOTE — Progress Notes (Signed)
 CODE STROKE- PHARMACY COMMUNICATION   Time CODE STROKE called/page received:1711  Time response to CODE STROKE was made (in person or via phone): immediately  Time Stroke Kit retrieved from [employer] (only if needed):not needed  Name of Provider/Nurse contacted:Dr Germaine  Past Medical History:  Diagnosis Date   Anginal pain    Cervical back pain with evidence of disc disease    GERD (gastroesophageal reflux disease)    History of back injury    Prior to Admission medications  Medication Sig Start Date End Date Taking? Authorizing Provider  rosuvastatin  (CRESTOR ) 5 MG tablet Take 5 mg by mouth daily. 03/16/24 03/16/25 Yes [provider]  aspirin  81 MG chewable tablet Chew 81 mg by mouth daily. Patient not taking: Reported on 07/22/2024    [provider]  Cholecalciferol (VITAMIN D3) 125 MCG (5000 UT) CAPS Take 1 capsule by mouth daily. 03/06/23   [provider]  diclofenac Sodium (VOLTAREN) 1 % GEL Apply 4 g topically 4 (four) times daily. Patient not taking: Reported on 07/22/2024    [provider]  ezetimibe  (ZETIA ) 10 MG tablet TAKE 1 TABLET BY MOUTH ONCE DAILY 07/13/24   Shirley, Amanda M, FNP  fluticasone  (FLONASE ) 50 MCG/ACT nasal spray Place 2 sprays into both nostrils daily.    [provider]  furosemide  (LASIX ) 20 MG tablet Take 20 mg by mouth daily.    [provider]  hydrOXYzine  (ATARAX ) 25 MG tablet Take 1 tablet (25 mg total) by mouth 4 (four) times daily. 07/22/24   Orlean Alan HERO, FNP  lidocaine  (LIDODERM ) 5 % Place 1 patch onto the skin daily. 07/22/24   Orlean Alan HERO, FNP  metoprolol  succinate (TOPROL -XL) 25 MG 24 hr tablet Take 25 mg by mouth daily.    [provider]  naloxone  (NARCAN ) nasal spray 4 mg/0.1 mL Place 1 spray into the nose as needed (For opioid overdose symptoms.). For overdose symptoms. Patient not taking: Reported on 07/22/2024 02/05/23   Orlean Alan HERO, FNP  Oxycodone  HCl 10 MG  TABS take 1 tablet by mouth three times daily as needed for severe pain. 01/30/23   Orlean Alan HERO, FNP  rosuvastatin  (CRESTOR ) 10 MG tablet Take 10 mg by mouth daily.    [provider]  tiZANidine (ZANAFLEX) 2 MG tablet Take 2 mg by mouth at bedtime. Patient taking differently: Take 2 mg by mouth as needed.    [provider]  Varenicline  Tartrate, Starter, (CHANTIX  STARTING MONTH PAK) 0.5 MG X 11 & 1 MG X 42 TBPK Take according to package instructions 04/08/24   Orlean Alan HERO, FNP  nitroGLYCERIN (NITROSTAT) 0.4 MG SL tablet Place under the tongue. 06/10/17 01/02/21  [provider]    Andrew Rios, PharmD Pharmacy Resident  08/26/2024 5:30 PM

## 2024-08-26 NOTE — H&P (Signed)
 "     [place]   PATIENT NAME: Andrew Rios    MR#:  [id number]  DATE OF BIRTH:  16-Jul-1966  DATE OF ADMISSION:  08/26/2024  PRIMARY CARE PHYSICIAN: Orlean Alan HERO, FNP   Patient is coming from: Home  REQUESTING/REFERRING PHYSICIAN: Ernest Shuck, MD  CHIEF COMPLAINT:   Chief Complaint  Patient presents with   Numbness         HISTORY OF PRESENT ILLNESS:  [name] is a 59 y.o. Caucasian male with medical history significant for GERD and chronic back pain, who presented to the emergency room with acute onset of left-sided weakness and numbness with associated slurred speech that started this afternoon.  No tinnitus or vertigo.  No witnessed seizures.  No urinary or stool incontinence.  He denied any chest pain or palpitations.  No nausea or vomiting or abdominal pain.  No bleeding diathesis.  ED Course: When the patient came to the ER, BP was 141/92 with otherwise normal vital signs.  Labs revealed unremarkable CMP and CBC showed macrocytosis. EKG as reviewed by me : EKG showed normal sinus rhythm with rate of 77. Imaging: Noncontrast head CT scan and a code stroke revealed no acute intracranial normality.  CTA of the head and neck revealed the following: 1. No large vessel occlusion, hemodynamically significant stenosis, or aneurysm in the head or neck. 2. Secretions in the right posterolateral trachea just below the thoracic inlet. Recommend correlation for symptoms of aspiration.  Brain MRI without contrast revealed acute infarct in the posterior limb of the right internal capsule.    The patient was given 4 with aspirin  and 300 mg of p.o. Plavix .  His symptoms have significant improved in the ER.  Neurology consult was obtained by Dr. Germaine.  The patient will be admitted to an observation telemetry bed for further evaluation and management. PAST MEDICAL HISTORY:   Past Medical History:  Diagnosis Date   Anginal pain    Cervical back pain with  evidence of disc disease    GERD (gastroesophageal reflux disease)    History of back injury     PAST SURGICAL HISTORY:   Past Surgical History:  Procedure Laterality Date   APPENDECTOMY     LEFT HEART CATH AND CORONARY ANGIOGRAPHY N/A 06/12/2017   Procedure: LEFT HEART CATH AND CORONARY ANGIOGRAPHY;  Surgeon: Hester Wolm PARAS, MD;  Location: [facility] INVASIVE CV [facility];  Service: Cardiovascular;  Laterality: N/A;    SOCIAL HISTORY:   Social History   Tobacco Use   Smoking status: Heavy Smoker    Current packs/day: 1.00    Average packs/day: 1 pack/day for 35.0 years (35.0 ttl pk-yrs)    Types: Cigarettes   Smokeless tobacco: Never  Substance Use Topics   Alcohol use: No    FAMILY HISTORY:   Family History  Problem Relation Age of Onset   Hypertension Mother    Diabetes Father     DRUG ALLERGIES:  Allergies[1]  REVIEW OF SYSTEMS:   ROS As per history of present illness. All pertinent systems were reviewed above. Constitutional, HEENT, cardiovascular, respiratory, GI, GU, musculoskeletal, neuro, psychiatric, endocrine, integumentary and hematologic systems were reviewed and are otherwise negative/unremarkable except for positive findings mentioned above in the HPI.   MEDICATIONS AT HOME:   Prior to Admission medications  Medication Sig Start Date End Date Taking? Authorizing Provider  aspirin  81 MG chewable tablet Chew 81 mg by mouth daily.   Yes [provider]  Cholecalciferol  (VITAMIN D3) 125  MCG (5000 UT) CAPS Take 1 capsule by mouth daily. 03/06/23  Yes [provider]  ezetimibe  (ZETIA ) 10 MG tablet TAKE 1 TABLET BY MOUTH ONCE DAILY 07/13/24  Yes Shirley, Amanda M, FNP  fluticasone  (FLONASE ) 50 MCG/ACT nasal spray Place 2 sprays into both nostrils daily.   Yes [provider]  furosemide  (LASIX ) 20 MG tablet Take 20 mg by mouth daily.   Yes [provider]  hydrOXYzine  (ATARAX ) 25 MG tablet Take 1 tablet (25 mg total) by mouth 4  (four) times daily. 07/22/24  Yes Orlean Alan HERO, FNP  metoprolol  succinate (TOPROL -XL) 25 MG 24 hr tablet Take 25 mg by mouth daily.   Yes [provider]  Oxycodone  HCl 10 MG TABS take 1 tablet by mouth three times daily as needed for severe pain. 01/30/23  Yes Orlean Alan HERO, FNP  rosuvastatin  (CRESTOR ) 10 MG tablet Take 10 mg by mouth daily.   Yes [provider]  tiZANidine (ZANAFLEX) 2 MG tablet Take 2 mg by mouth at bedtime. Patient taking differently: Take 2 mg by mouth as needed.   Yes [provider]  diclofenac Sodium (VOLTAREN) 1 % GEL Apply 4 g topically 4 (four) times daily. Patient not taking: Reported on 08/26/2024    [provider]  lidocaine  (LIDODERM ) 5 % Place 1 patch onto the skin daily. Patient not taking: Reported on 08/26/2024 07/22/24   Orlean Alan HERO, FNP  naloxone  (NARCAN ) nasal spray 4 mg/0.1 mL Place 1 spray into the nose as needed (For opioid overdose symptoms.). For overdose symptoms. Patient not taking: No sig reported 02/05/23   Orlean Alan HERO, FNP  rosuvastatin  (CRESTOR ) 5 MG tablet Take 5 mg by mouth daily. Patient not taking: Reported on 08/26/2024 03/16/24 03/16/25  [provider]  Varenicline  Tartrate, Starter, (CHANTIX  STARTING MONTH PAK) 0.5 MG X 11 & 1 MG X 42 TBPK Take according to package instructions Patient not taking: Reported on 08/26/2024 04/08/24   Orlean Alan HERO, FNP  nitroGLYCERIN (NITROSTAT) 0.4 MG SL tablet Place under the tongue. 06/10/17 01/02/21  [provider]      VITAL SIGNS:  Blood pressure 124/77, pulse (!) 58, temperature 98 F (36.7 C), temperature source Oral, resp. rate 18, height 5' 7 (1.702 m), weight 76.2 kg, SpO2 98%.  PHYSICAL EXAMINATION:  Physical Exam  GENERAL:  59 y.o.-year-old Caucasian male patient lying in the bed with no acute distress.  EYES: Pupils equal, round, reactive to light and accommodation. No scleral icterus. Extraocular muscles intact.   HEENT: Head atraumatic, normocephalic. Oropharynx and nasopharynx clear.  NECK:  Supple, no jugular venous distention. No thyroid  enlargement, no tenderness.  LUNGS: Normal breath sounds bilaterally, no wheezing, rales,rhonchi or crepitation. No use of accessory muscles of respiration.  CARDIOVASCULAR: Regular rate and rhythm, S1, S2 normal. No murmurs, rubs, or gallops.  ABDOMEN: Soft, nondistended, nontender. Bowel sounds present. No organomegaly or mass.  EXTREMITIES: No pedal edema, cyanosis, or clubbing.  NEUROLOGIC: Cranial nerves II through XII are intact.  Slurred speech and significant improved.  Left-sided upper and lower extremity muscle strength was 4-5/5 compared to 5/5 in the right upper and lower extremities.  Sensation intact. Gait not checked.  PSYCHIATRIC: The patient is alert and oriented x 3.  Normal affect and good eye contact. SKIN: No obvious rash, lesion, or ulcer.   LABORATORY PANEL:   CBC Recent Labs  Lab 08/26/24 1707  WBC 9.6  HGB 13.6  HCT 38.6*  PLT 316   ------------------------------------------------------------------------------------------------------------------  Chemistries  Recent Labs  Lab 08/26/24 1707  NA 140  K 3.9  CL 101  CO2 29  GLUCOSE 100*  BUN 8  CREATININE 0.87  CALCIUM  9.1  AST 20  ALT 28  ALKPHOS 120  BILITOT 0.3   ------------------------------------------------------------------------------------------------------------------  Cardiac Enzymes No results for input(s): TROPONINI in the last 168 hours. ------------------------------------------------------------------------------------------------------------------  RADIOLOGY:  MR BRAIN WO CONTRAST Result Date: 08/26/2024 EXAM: MRI Brain Without Contrast 08/26/2024 11:16:03 PM TECHNIQUE: Multiplanar multisequence MRI of the head/brain was performed without the administration of intravenous contrast. 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 No intracranial hemorrhage. No mass. No midline shift. No hydrocephalus. The sella is unremarkable. Normal flow voids. ORBITS: No significant abnormality. SINUSES AND MASTOIDS: No significant abnormality. BONES AND SOFT TISSUES: Normal marrow signal. No soft tissue abnormality. IMPRESSION: 1. 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 Electronically signed by: Glendia Molt MD 08/26/2024 11:36 PM EST RP Workstation: [id number]   CT ANGIO HEAD NECK W WO CM (CODE STROKE) Result Date: 08/26/2024 EXAM: CTA HEAD AND NECK WITH AND WITHOUT 08/26/2024 05:47:32 PM TECHNIQUE: CTA of the head and neck was performed with and without the administration of 100 mL of iohexol  (OMNIPAQUE ) 350 MG/ML injection. Multiplanar 2D and/or 3D reformatted images are provided for review. Automated exposure control, iterative reconstruction, and/or weight based adjustment of the mA/kV was utilized to reduce the radiation dose to as low as reasonably achievable. Stenosis of the internal carotid arteries measured using NASCET criteria. COMPARISON: Same day head CT. CLINICAL HISTORY: Neuro deficit, acute, stroke suspected. FINDINGS: CTA NECK: AORTIC ARCH AND ARCH VESSELS: The aortic arch is not included within the field of view. No dissection or arterial injury. No significant stenosis of the brachiocephalic or subclavian arteries. CERVICAL CAROTID ARTERIES: Minimal atherosclerosis at the right carotid bifurcation without hemodynamically significant stenosis. Minimal atherosclerosis at the left carotid bifurcation without hemodynamically significant stenosis. No dissection or arterial injury. CERVICAL VERTEBRAL ARTERIES: No dissection, arterial injury, or significant stenosis. LUNGS AND MEDIASTINUM: Paraseptal emphysema in the lung apices. Secretions noted in the right posterolateral aspect of the trachea  just below the thoracic inlet; recommend correlation for evidence of aspiration. SOFT TISSUES: No acute abnormality. BONES: Mild degenerative changes in the visualized spine. Edentulous maxilla and mandible. CTA HEAD: ANTERIOR CIRCULATION: Mild atherosclerosis of the carotid siphons without hemodynamically significant stenosis. No significant stenosis of the anterior cerebral arteries. No significant stenosis of the middle cerebral arteries. No aneurysm. POSTERIOR CIRCULATION: No significant stenosis of the posterior cerebral arteries. No significant stenosis of the basilar artery. No significant stenosis of the vertebral arteries. No aneurysm. OTHER: No dural venous sinus thrombosis on this non-dedicated study. IMPRESSION: 1. No large vessel occlusion, hemodynamically significant stenosis, or aneurysm in the head or neck. 2. Secretions in the right posterolateral trachea just below the thoracic inlet. Recommend correlation for symptoms of aspiration. Electronically signed by: Donnice Mania MD 08/26/2024 06:03 PM EST RP Workstation: [id number]   CT Angio Chest/Abd/Pel for Dissection W and/or Wo Contrast Result Date: 08/26/2024 CLINICAL DATA:  Acute aortic syndrome suspected.  Back pain. EXAM: CT ANGIOGRAPHY CHEST, ABDOMEN AND PELVIS TECHNIQUE: Non-contrast CT of the chest was initially obtained. Multidetector CT imaging through the chest, abdomen and pelvis was performed using the standard protocol during bolus administration of intravenous contrast. Multiplanar reconstructed images and MIPs were obtained and reviewed to evaluate the vascular  anatomy. RADIATION DOSE REDUCTION: This exam was performed according to the departmental dose-optimization program which includes automated exposure control, adjustment of the mA and/or kV according to patient size and/or use of iterative reconstruction technique. CONTRAST:  OMNIPAQUE  IOHEXOL  350 MG/ML SOLN COMPARISON:  None Available. FINDINGS: CTA CHEST FINDINGS  Cardiovascular: There is no cardiomegaly or pericardial effusion. The thoracic aorta is unremarkable. The origins of the great vessels of the aortic arch appear patent. No pulmonary artery embolus identified. Mediastinum/Nodes: No hilar adenopathy. Top-normal lymph node in the prevascular space measures 10 mm short axis. Subcarinal lymph node measures 11 mm in short axis. The esophagus is grossly unremarkable. No mediastinal fluid collection. Lungs/Pleura: Background of paraseptal emphysema. No focal consolidation, pleural effusion, pneumothorax. The central airways are patent. Musculoskeletal: No acute osseous pathology. Review of the MIP images confirms the above findings. CTA ABDOMEN AND PELVIS FINDINGS VASCULAR Aorta: Mild atherosclerotic calcification. No aneurysmal dilatation or dissection. No periaortic fluid collection. Celiac: Patent without evidence of aneurysm, dissection, vasculitis or significant stenosis. SMA: Patent without evidence of aneurysm, dissection, vasculitis or significant stenosis. Renals: Both renal arteries are patent without evidence of aneurysm, dissection, vasculitis, fibromuscular dysplasia or significant stenosis. IMA: Patent without evidence of aneurysm, dissection, vasculitis or significant stenosis. Inflow: Mild atherosclerotic calcification. No aneurysmal dilatation or dissection. The iliac arteries are patent. Veins: No obvious venous abnormality within the limitations of this arterial phase study. Review of the MIP images confirms the above findings. NON-VASCULAR No intra-abdominal free air or free fluid. Hepatobiliary: The liver is unremarkable. No biliary ductal dilatation. The gallbladder is unremarkable. Pancreas: Unremarkable. No pancreatic ductal dilatation or surrounding inflammatory changes. Spleen: Normal in size without focal abnormality. Adrenals/Urinary Tract: The adrenal glands unremarkable. There is no hydronephrosis on either side. There is symmetric enhancement  and excretion of contrast by both kidneys. The visualized ureters and urinary bladder appear unremarkable. Stomach/Bowel: There is no bowel obstruction or active inflammation. Appendectomy. Lymphatic: No adenopathy. Reproductive: The prostate and seminal vesicles are grossly remarkable Other: Small fat containing left inguinal hernia. Musculoskeletal: Degenerative changes of the spine. No acute osseous pathology. Review of the MIP images confirms the above findings. IMPRESSION: 1. No acute intrathoracic, abdominal, or pelvic pathology. No aortic aneurysm or dissection. 2. Emphysema (ICD10-J43.9). Given the presence of pulmonary emphysema, an independent risk factor for lung cancer, consider evaluating the patient for a low-dose CT lung cancer screening program. Electronically Signed   By: Vanetta Chou M.D.   On: 08/26/2024 17:59   CT HEAD CODE STROKE WO CONTRAST Result Date: 08/26/2024 EXAM: CT HEAD WITHOUT CONTRAST 08/26/2024 05:16:07 PM TECHNIQUE: CT of the head was performed without the administration of intravenous contrast. Automated exposure control, iterative reconstruction, and/or weight based adjustment of the mA/kV was utilized to reduce the radiation dose to as low as reasonably achievable. COMPARISON: None available. CLINICAL HISTORY: Acute neurological deficit, stroke suspected. FINDINGS: BRAIN AND VENTRICLES: No acute hemorrhage. No evidence of acute infarct. No hydrocephalus. No extra-axial collection. No mass effect or midline shift. [place] Stroke Program Early CT (ASPECT) score: Ganglionic (caudate, IC, lentiform nucleus, insula, M1-M3): 7 Supraganglionic (M4-M6): 3 Total: 10 ORBITS: No acute abnormality. SINUSES: No acute abnormality. SOFT TISSUES AND SKULL: Left mastoid effusion. No acute soft tissue abnormality. No skull fracture. VASCULATURE: There is sclerosis of the carotid siphons. IMPRESSION: 1. No acute intracranial abnormality. 2. ASPECTS 10. 3. Findings discussed with Dr. Ernest by  telephone at 5:35 PM on 08/26/24. Electronically signed by: Donnice Mania MD 08/26/2024 05:36 PM EST  RP Workstation: [id number]      IMPRESSION AND PLAN:  Assessment and Plan: Acute CVA (cerebrovascular accident) (HCC) - This is an ischemic infarct in the posterior limb of the right internal capsule with subsequent left-sided weakness and numbness as well as slurred speech which are improving. - The patient will be admitted to an observation medically monitored bed.   - We will follow neuro checks q.4 hours for 24 hours.   - The patient will be placed on aspirin  and will add Plavix .   - Will obtain a brain MRI without contrast as well as bilateral carotid Doppler and 2D echo with bubble study .   - A neurology consultation  as well as physical/occupation/speech therapy consults will be obtained in a.m..  Dr. Germaine was notified about the patient and evaluated the patient in the ER.  She will follow with us . - The patient will be placed on statin therapy and fasting lipids will be checked.    Dyslipidemia -Will continue statin therapy and Zetia  and check fasting lipids.  Tobacco abuse - She was counseled for smoking cessation and will receive further counseling here.  Essential hypertension - Permissive hypertension will be allowed. - Continue Toprol -XL with permissive parameters.   DVT prophylaxis: Lovenox .  Advanced Care Planning:  Code Status: full code.  Family Communication:  The plan of care was discussed in details with the patient (and family). I answered all questions. The patient agreed to proceed with the above mentioned plan. Further management will depend upon hospital course. Disposition Plan: Back to previous home environment Consults called: Neurology. All the records are reviewed and case discussed with ED provider.  Status is: Observation  I certify that at the time of admission, it is my clinical judgment that the patient will require hospital care extending  less than 2 midnights.                            Dispo: The patient is from: Home              Anticipated d/c is to: Home              Patient currently is not medically stable to d/c.              Difficult to place patient: No  [place] DELENA Peaches M.D on 08/27/2024 at 1:17 AM  [facility]   From 7 PM-7 AM, contact night-coverage [email or web address]  CC: Primary care physician; Orlean Alan HERO, FNP     [1]  Allergies Allergen Reactions   Gabapentin Other (See Comments)    Made him nauseous and tried  Made him nauseous and tried  Made him nauseous and tried   "

## 2024-08-26 NOTE — ED Notes (Signed)
 Called CCMD to add pt to monitoring.

## 2024-08-26 NOTE — ED Triage Notes (Addendum)
 Pt to Ed via POV from home. Pt reports was working on car and moving parts PTA and started to have worsening lower back pain then left leg went numb and then left arm went numb. Pt also reports speech was slurred but getting better. Everyday smoker. No hx of CVA. Pt reports is on blood thinner but none listed in chart. Sister with pt.   During RN NIH assessment drift noted to left arm and reports heaviness to arm and left leg.

## 2024-08-26 NOTE — Consult Note (Signed)
 [facility] Telemedicine Consult   Requesting Provider: Ernest Consult Participants: Nurse Location of the provider: [place], [place] Location of the patient: [facility] ED  This consult was provided via telemedicine with 2-way video and audio communication. The patient/family was informed that care would be provided in this way and agreed to receive care in this manner.    Chief Complaint: Left sided numbness and weakness  HPI: 59 year old male with a history of tobacco use, GERD and back injury who presents with complaints of left sided numbness and weakness.  Patient reported that he was working on his car and had acute onset of LLE numbness.  Numbness traveled up the body to include the left arm and face.  Felt weak as well and therefore presented for evaluation.   Patient also complains of back and chest pain.    LKW: 08/26/24 @ 1615 tnk given?: No, Resolving symptoms with patient now with nondisabling symptoms (repeat NIHSS of 1 for mild LUE dysmetria) IR Thrombectomy? No, No target lesion identified Modified Rankin Scale: 0-Completely asymptomatic and back to baseline post- stroke Time of teleneurologist evaluation: 1713  Exam: Vitals:   08/26/24 1705  BP: (!) 141/92  Pulse: 72  Resp: 18  Temp: 98 F (36.7 C)  SpO2: 98%    General: NAD  1A: Level of Consciousness - 0 1B: Ask Month and Age - 0 1C: 'Blink Eyes' & 'Squeeze Hands' - 0 2: Test Horizontal Extraocular Movements - 0 3: Test Visual Fields - 0 4: Test Facial Palsy - 1 5A: Test Left Arm Motor Drift - 0 5B: Test Right Arm Motor Drift - 0 6A: Test Left Leg Motor Drift - 0 6B: Test Right Leg Motor Drift - 0 7: Test Limb Ataxia - 2 8: Test Sensation - 0 9: Test Language/Aphasia- 0 10: Test Dysarthria - 0 11: Test Extinction/Inattention - 0 NIHSS score: 3 (initial)   Imaging Reviewed:  CT HEAD WITHOUT CONTRAST 08/26/2024 05:16:07 PM   TECHNIQUE: CT of the head was performed without the  administration of intravenous contrast. Automated exposure control, iterative reconstruction, and/or weight based adjustment of the mA/kV was utilized to reduce the radiation dose to as low as reasonably achievable.   COMPARISON: None available.   CLINICAL HISTORY: Acute neurological deficit, stroke suspected.   FINDINGS:   BRAIN AND VENTRICLES: No acute hemorrhage. No evidence of acute infarct. No hydrocephalus. No extra-axial collection. No mass effect or midline shift. [place] Stroke Program Early CT (ASPECT) score: Ganglionic (caudate, IC, lentiform nucleus, insula, M1-M3): 7 Supraganglionic (M4-M6): 3 Total: 10   ORBITS: No acute abnormality.   SINUSES: No acute abnormality.   SOFT TISSUES AND SKULL: Left mastoid effusion. No acute soft tissue abnormality. No skull fracture.   VASCULATURE: There is sclerosis of the carotid siphons.   IMPRESSION: 1. No acute intracranial abnormality. 2. ASPECTS 10. 3. Findings discussed with Dr. Ernest by telephone at 5:35 PM on 08/26/24.  CTA HEAD AND NECK WITH AND WITHOUT 08/26/2024 05:47:32 PM   TECHNIQUE: CTA of the head and neck was performed with and without the administration of 100 mL of iohexol  (OMNIPAQUE ) 350 MG/ML injection. Multiplanar 2D and/or 3D reformatted images are provided for review. Automated exposure control, iterative reconstruction, and/or weight based adjustment of the mA/kV was utilized to reduce the radiation dose to as low as reasonably achievable. Stenosis of the internal carotid arteries measured using NASCET criteria.   COMPARISON: Same day head CT.   CLINICAL HISTORY: Neuro deficit, acute, stroke suspected.  FINDINGS:   CTA NECK: AORTIC ARCH AND ARCH VESSELS: The aortic arch is not included within the field of view. No dissection or arterial injury. No significant stenosis of the brachiocephalic or subclavian arteries.   CERVICAL CAROTID ARTERIES: Minimal atherosclerosis at the right  carotid bifurcation without hemodynamically significant stenosis. Minimal atherosclerosis at the left carotid bifurcation without hemodynamically significant stenosis. No dissection or arterial injury.   CERVICAL VERTEBRAL ARTERIES: No dissection, arterial injury, or significant stenosis.   LUNGS AND MEDIASTINUM: Paraseptal emphysema in the lung apices. Secretions noted in the right posterolateral aspect of the trachea just below the thoracic inlet; recommend correlation for evidence of aspiration.   SOFT TISSUES: No acute abnormality.   BONES: Mild degenerative changes in the visualized spine. Edentulous maxilla and mandible.   CTA HEAD:   ANTERIOR CIRCULATION: Mild atherosclerosis of the carotid siphons without hemodynamically significant stenosis. No significant stenosis of the anterior cerebral arteries. No significant stenosis of the middle cerebral arteries. No aneurysm.   POSTERIOR CIRCULATION: No significant stenosis of the posterior cerebral arteries. No significant stenosis of the basilar artery. No significant stenosis of the vertebral arteries. No aneurysm.   OTHER: No dural venous sinus thrombosis on this non-dedicated study.   IMPRESSION: 1. No large vessel occlusion, hemodynamically significant stenosis, or aneurysm in the head or neck. 2. Secretions in the right posterolateral trachea just below the thoracic inlet. Recommend correlation for symptoms of aspiration.    Labs reviewed in epic and pertinent values follow: 101   Assessment: 59 year old male with a history of tobacco use, GERD and back injury who presents with complaints of left sided numbness and weakness.  Symptoms improving but patient not to back to baseline but with a NIHSS of 3 initially improving to NIHSS of 1.  Head CT personally reviewed and shows no acute changes.  With re-evaluation thrombolytics not administered.  Patient in agreement.  CTA of the head and neck personally reviewed  and shows no evidence of LVO. Suspect TIA/small infarct.  Further work up and evaluation recommended.  Patient on no antiplatelet therapy or anticoagulation prior to presentation.    Recommendations:  1. HgbA1c, fasting lipid panel 2. MRI of the brain without contrast 3. PT consult, OT consult, Speech consult 4. Echocardiogram 5. Smoking cessation counseling 6. Prophylactic therapy-ASA 325ng and Plavix  300mg  now 7. NPO until RN stroke swallow screen 8. Telemetry monitoring 9. Frequent neuro checks and NIHSS per protocol while patient is in the window-until 2045.  If worsening patient should be evaluated again for possible thrombolytic necessity.    10. Permissive BP management with no treatment of SBP<200 for the first 24-48 hours.  Goal BP after that time <140/80    Case discussed with Dr. Ernest    This patient is receiving care for possible acute neurological changes. Care by this provider at the time of service included time for direct evaluation via telemedicine, review of medical records, imaging studies and discussion of findings with providers, the patient and/or family.  Sonny Hock, MD Neurology   If 8pm- 8am, please page neurology on call as listed in AMION.

## 2024-08-27 ENCOUNTER — Observation Stay: Admit: 2024-08-27 | Discharge: 2024-08-27 | Disposition: A | Attending: Family Medicine

## 2024-08-27 ENCOUNTER — Encounter: Payer: Self-pay | Admitting: Family Medicine

## 2024-08-27 DIAGNOSIS — I739 Peripheral vascular disease, unspecified: Secondary | ICD-10-CM

## 2024-08-27 DIAGNOSIS — I639 Cerebral infarction, unspecified: Secondary | ICD-10-CM | POA: Diagnosis not present

## 2024-08-27 DIAGNOSIS — I6350 Cerebral infarction due to unspecified occlusion or stenosis of unspecified cerebral artery: Secondary | ICD-10-CM | POA: Diagnosis present

## 2024-08-27 DIAGNOSIS — M5441 Lumbago with sciatica, right side: Secondary | ICD-10-CM | POA: Diagnosis present

## 2024-08-27 DIAGNOSIS — M5442 Lumbago with sciatica, left side: Secondary | ICD-10-CM

## 2024-08-27 DIAGNOSIS — Z888 Allergy status to other drugs, medicaments and biological substances status: Secondary | ICD-10-CM | POA: Diagnosis not present

## 2024-08-27 DIAGNOSIS — G8194 Hemiplegia, unspecified affecting left nondominant side: Secondary | ICD-10-CM | POA: Diagnosis present

## 2024-08-27 DIAGNOSIS — Z87828 Personal history of other (healed) physical injury and trauma: Secondary | ICD-10-CM | POA: Diagnosis not present

## 2024-08-27 DIAGNOSIS — G8929 Other chronic pain: Secondary | ICD-10-CM

## 2024-08-27 DIAGNOSIS — Z7982 Long term (current) use of aspirin: Secondary | ICD-10-CM | POA: Diagnosis not present

## 2024-08-27 DIAGNOSIS — I110 Hypertensive heart disease with heart failure: Secondary | ICD-10-CM | POA: Diagnosis present

## 2024-08-27 DIAGNOSIS — R079 Chest pain, unspecified: Secondary | ICD-10-CM | POA: Diagnosis present

## 2024-08-27 DIAGNOSIS — Z720 Tobacco use: Secondary | ICD-10-CM | POA: Diagnosis not present

## 2024-08-27 DIAGNOSIS — Z716 Tobacco abuse counseling: Secondary | ICD-10-CM | POA: Diagnosis not present

## 2024-08-27 DIAGNOSIS — G459 Transient cerebral ischemic attack, unspecified: Secondary | ICD-10-CM | POA: Diagnosis present

## 2024-08-27 DIAGNOSIS — D7589 Other specified diseases of blood and blood-forming organs: Secondary | ICD-10-CM | POA: Diagnosis present

## 2024-08-27 DIAGNOSIS — I10 Essential (primary) hypertension: Secondary | ICD-10-CM

## 2024-08-27 DIAGNOSIS — F17210 Nicotine dependence, cigarettes, uncomplicated: Secondary | ICD-10-CM | POA: Diagnosis present

## 2024-08-27 DIAGNOSIS — R29810 Facial weakness: Secondary | ICD-10-CM | POA: Diagnosis present

## 2024-08-27 DIAGNOSIS — Z8249 Family history of ischemic heart disease and other diseases of the circulatory system: Secondary | ICD-10-CM | POA: Diagnosis not present

## 2024-08-27 DIAGNOSIS — R29703 NIHSS score 3: Secondary | ICD-10-CM | POA: Diagnosis present

## 2024-08-27 DIAGNOSIS — I6381 Other cerebral infarction due to occlusion or stenosis of small artery: Secondary | ICD-10-CM | POA: Diagnosis not present

## 2024-08-27 DIAGNOSIS — E785 Hyperlipidemia, unspecified: Secondary | ICD-10-CM | POA: Diagnosis present

## 2024-08-27 DIAGNOSIS — Z833 Family history of diabetes mellitus: Secondary | ICD-10-CM | POA: Diagnosis not present

## 2024-08-27 DIAGNOSIS — Z79899 Other long term (current) drug therapy: Secondary | ICD-10-CM | POA: Diagnosis not present

## 2024-08-27 LAB — LIPID PANEL
Cholesterol: 156 mg/dL (ref 0–200)
HDL: 32 mg/dL — ABNORMAL LOW
LDL Cholesterol: 103 mg/dL — ABNORMAL HIGH (ref 0–99)
Total CHOL/HDL Ratio: 4.8 ratio
Triglycerides: 106 mg/dL
VLDL: 21 mg/dL (ref 0–40)

## 2024-08-27 LAB — ECHOCARDIOGRAM COMPLETE BUBBLE STUDY
Area-P 1/2: 2.87 cm2
S' Lateral: 2.6 cm
Single Plane A4C EF: 60.5 %

## 2024-08-27 MED ORDER — CLOPIDOGREL BISULFATE 75 MG PO TABS
75.0000 mg | ORAL_TABLET | Freq: Every day | ORAL | Status: DC
  Administered 2024-08-27 – 2024-08-28 (×2): 75 mg via ORAL
  Filled 2024-08-27 (×2): qty 1

## 2024-08-27 MED ORDER — NICOTINE 21 MG/24HR TD PT24
21.0000 mg | MEDICATED_PATCH | Freq: Every day | TRANSDERMAL | Status: DC
  Filled 2024-08-27 (×2): qty 1

## 2024-08-27 MED ORDER — ADULT MULTIVITAMIN W/MINERALS CH
1.0000 | ORAL_TABLET | Freq: Every day | ORAL | Status: DC
  Administered 2024-08-28: 1 via ORAL
  Filled 2024-08-27: qty 1

## 2024-08-27 NOTE — Assessment & Plan Note (Addendum)
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 - The patient will be admitted to an observation medically monitored bed.   - We will follow neuro checks q.4 hours for 24 hours.   - The patient will be placed on aspirin  and will add Plavix .   - Will obtain a brain MRI without contrast as well as bilateral carotid Doppler and 2D echo with bubble study .   - A neurology consultation  as well as physical/occupation/speech therapy consults will be obtained in a.m..  Dr. Germaine was notified about the patient and evaluated the patient in the ER.  She will follow with us . - The patient will be placed on statin therapy and fasting lipids will be checked.

## 2024-08-27 NOTE — Progress Notes (Signed)
 Dr Tobie made aware that pt has refused to wear telemetry monitor, pt took monitor off and gave to NT

## 2024-08-27 NOTE — Assessment & Plan Note (Signed)
She was counseled for smoking cessation and will receive further counseling here. 

## 2024-08-27 NOTE — Assessment & Plan Note (Addendum)
-  Will continue statin therapy and Zetia  and check fasting lipids.

## 2024-08-27 NOTE — Progress Notes (Signed)

## 2024-08-27 NOTE — TOC CM/SW Note (Addendum)
" ° °  RNCM met with patient and Mother in room.  Patient lives with mother and intends to return there once discharged.  He is independent , drives and able to obtain medications, denies any difficulty paying for them.  No DME or homecare services in home.  He does have a PCP.    PT/OT recommending inpatient rehab.  Will continue to follow for any discharge needs  "

## 2024-08-27 NOTE — Progress Notes (Signed)
 Subjective: Patient worsened overnight and has left sided weakness this morning.  His pain has been an issue.  Is refusing to be on telemetry.  Objective: Current vital signs: BP 127/86 (BP Location: Left Arm)   Pulse 64   Temp 98.3 F (36.8 C) (Oral)   Resp 14   Ht 5' 7 (1.702 m)   Wt 76.2 kg Comment: From D/C Summary on 07/22/24  SpO2 97%   BMI 26.31 kg/m  Vital signs in last 24 hours: Temp:  [97.8 F (36.6 C)-98.3 F (36.8 C)] 98.3 F (36.8 C) (01/23 0800) Pulse Rate:  [53-77] 64 (01/23 0800) Resp:  [14-28] 14 (01/23 0800) BP: (117-142)/(68-92) 127/86 (01/23 0800) SpO2:  [92 %-100 %] 97 % (01/23 0800) Weight:  [76.2 kg] 76.2 kg (01/22 2219)  Intake/Output from previous day: No intake/output data recorded. Intake/Output this shift: No intake/output data recorded. Nutritional status:  Diet Order             Diet Heart Room service appropriate? Yes; Fluid consistency: Thin  Diet effective now                   Neurologic Exam: Mental Status: Alert, oriented, thought content appropriate.  Speech fluent without evidence of aphasia.  Able to follow 3 step commands without difficulty. Cranial Nerves: II: Visual fields grossly normal III,IV, VI: ptosis not present, extra-ocular motions intact bilaterally V,VII: mild left facial droop, facial light touch sensation normal bilaterally VIII: hearing normal bilaterally XI: bilateral shoulder shrug XII: midline tongue extension Motor: 5/5 on the right.  4-/5 LUE and LLE Sensory: Pinprick and light touch intact throughout, bilaterally Deep Tendon Reflexes: Symmetric throughout   Lab Results: Basic Metabolic Panel: Recent Labs  Lab 08/26/24 1707  NA 140  K 3.9  CL 101  CO2 29  GLUCOSE 100*  BUN 8  CREATININE 0.87  CALCIUM  9.1    Liver Function Tests: Recent Labs  Lab 08/26/24 1707  AST 20  ALT 28  ALKPHOS 120  BILITOT 0.3  PROT 6.9  ALBUMIN 4.4   No results for input(s): LIPASE, AMYLASE in  the last 168 hours. No results for input(s): AMMONIA in the last 168 hours.  CBC: Recent Labs  Lab 08/26/24 1707  WBC 9.6  NEUTROABS 5.9  HGB 13.6  HCT 38.6*  MCV 104.6*  PLT 316    Cardiac Enzymes: No results for input(s): CKTOTAL, CKMB, CKMBINDEX, TROPONINI in the last 168 hours.  Lipid Panel: Recent Labs  Lab 08/27/24 0554  CHOL 156  TRIG 106  HDL 32*  CHOLHDL 4.8  VLDL 21  LDLCALC 896*    CBG: Recent Labs  Lab 08/26/24 1706  GLUCAP 101*    Microbiology: No results found for this or any previous visit.  Coagulation Studies: Recent Labs    08/26/24 1707  LABPROT 13.5  INR 1.0    Imaging: MR BRAIN WO CONTRAST Result Date: 08/26/2024 EXAM: MRI Brain Without Contrast 08/26/2024 11:16:03 PM TECHNIQUE: Multiplanar multisequence MRI of the head/brain was performed without the administration of intravenous contrast. COMPARISON: CT Head 08/26/24 CLINICAL HISTORY: Neuro deficit, acute, stroke suspected FINDINGS: BRAIN AND VENTRICLES: Acute infarct in the posterior limb of the right internal capsule. No intracranial hemorrhage. No mass. No midline shift. No hydrocephalus. The sella is unremarkable. Normal flow voids. ORBITS: No significant abnormality. SINUSES AND MASTOIDS: No significant abnormality. BONES AND SOFT TISSUES: Normal marrow signal. No soft tissue abnormality. IMPRESSION: 1. Acute infarct in the posterior limb of the right internal  capsule. Electronically signed by: Glendia Molt MD 08/26/2024 11:36 PM EST RP Workstation: [id number]   CT ANGIO HEAD NECK W WO CM (CODE STROKE) Result Date: 08/26/2024 EXAM: CTA HEAD AND NECK WITH AND WITHOUT 08/26/2024 05:47:32 PM TECHNIQUE: CTA of the head and neck was performed with and without the administration of 100 mL of iohexol  (OMNIPAQUE ) 350 MG/ML injection. Multiplanar 2D and/or 3D reformatted images are provided for review. Automated exposure control, iterative reconstruction, and/or weight based adjustment  of the mA/kV was utilized to reduce the radiation dose to as low as reasonably achievable. Stenosis of the internal carotid arteries measured using NASCET criteria. COMPARISON: Same day head CT. CLINICAL HISTORY: Neuro deficit, acute, stroke suspected. FINDINGS: CTA NECK: AORTIC ARCH AND ARCH VESSELS: The aortic arch is not included within the field of view. No dissection or arterial injury. No significant stenosis of the brachiocephalic or subclavian arteries. CERVICAL CAROTID ARTERIES: Minimal atherosclerosis at the right carotid bifurcation without hemodynamically significant stenosis. Minimal atherosclerosis at the left carotid bifurcation without hemodynamically significant stenosis. No dissection or arterial injury. CERVICAL VERTEBRAL ARTERIES: No dissection, arterial injury, or significant stenosis. LUNGS AND MEDIASTINUM: Paraseptal emphysema in the lung apices. Secretions noted in the right posterolateral aspect of the trachea just below the thoracic inlet; recommend correlation for evidence of aspiration. SOFT TISSUES: No acute abnormality. BONES: Mild degenerative changes in the visualized spine. Edentulous maxilla and mandible. CTA HEAD: ANTERIOR CIRCULATION: Mild atherosclerosis of the carotid siphons without hemodynamically significant stenosis. No significant stenosis of the anterior cerebral arteries. No significant stenosis of the middle cerebral arteries. No aneurysm. POSTERIOR CIRCULATION: No significant stenosis of the posterior cerebral arteries. No significant stenosis of the basilar artery. No significant stenosis of the vertebral arteries. No aneurysm. OTHER: No dural venous sinus thrombosis on this non-dedicated study. IMPRESSION: 1. No large vessel occlusion, hemodynamically significant stenosis, or aneurysm in the head or neck. 2. Secretions in the right posterolateral trachea just below the thoracic inlet. Recommend correlation for symptoms of aspiration. Electronically signed by: Donnice Mania MD 08/26/2024 06:03 PM EST RP Workstation: [id number]   CT Angio Chest/Abd/Pel for Dissection W and/or Wo Contrast Result Date: 08/26/2024 CLINICAL DATA:  Acute aortic syndrome suspected.  Back pain. EXAM: CT ANGIOGRAPHY CHEST, ABDOMEN AND PELVIS TECHNIQUE: Non-contrast CT of the chest was initially obtained. Multidetector CT imaging through the chest, abdomen and pelvis was performed using the standard protocol during bolus administration of intravenous contrast. Multiplanar reconstructed images and MIPs were obtained and reviewed to evaluate the vascular anatomy. RADIATION DOSE REDUCTION: This exam was performed according to the departmental dose-optimization program which includes automated exposure control, adjustment of the mA and/or kV according to patient size and/or use of iterative reconstruction technique. CONTRAST:  OMNIPAQUE  IOHEXOL  350 MG/ML SOLN COMPARISON:  None Available. FINDINGS: CTA CHEST FINDINGS Cardiovascular: There is no cardiomegaly or pericardial effusion. The thoracic aorta is unremarkable. The origins of the great vessels of the aortic arch appear patent. No pulmonary artery embolus identified. Mediastinum/Nodes: No hilar adenopathy. Top-normal lymph node in the prevascular space measures 10 mm short axis. Subcarinal lymph node measures 11 mm in short axis. The esophagus is grossly unremarkable. No mediastinal fluid collection. Lungs/Pleura: Background of paraseptal emphysema. No focal consolidation, pleural effusion, pneumothorax. The central airways are patent. Musculoskeletal: No acute osseous pathology. Review of the MIP images confirms the above findings. CTA ABDOMEN AND PELVIS FINDINGS VASCULAR Aorta: Mild atherosclerotic calcification. No aneurysmal dilatation or dissection. No periaortic fluid collection. Celiac: Patent without evidence of aneurysm,  dissection, vasculitis or significant stenosis. SMA: Patent without evidence of aneurysm, dissection, vasculitis or  significant stenosis. Renals: Both renal arteries are patent without evidence of aneurysm, dissection, vasculitis, fibromuscular dysplasia or significant stenosis. IMA: Patent without evidence of aneurysm, dissection, vasculitis or significant stenosis. Inflow: Mild atherosclerotic calcification. No aneurysmal dilatation or dissection. The iliac arteries are patent. Veins: No obvious venous abnormality within the limitations of this arterial phase study. Review of the MIP images confirms the above findings. NON-VASCULAR No intra-abdominal free air or free fluid. Hepatobiliary: The liver is unremarkable. No biliary ductal dilatation. The gallbladder is unremarkable. Pancreas: Unremarkable. No pancreatic ductal dilatation or surrounding inflammatory changes. Spleen: Normal in size without focal abnormality. Adrenals/Urinary Tract: The adrenal glands unremarkable. There is no hydronephrosis on either side. There is symmetric enhancement and excretion of contrast by both kidneys. The visualized ureters and urinary bladder appear unremarkable. Stomach/Bowel: There is no bowel obstruction or active inflammation. Appendectomy. Lymphatic: No adenopathy. Reproductive: The prostate and seminal vesicles are grossly remarkable Other: Small fat containing left inguinal hernia. Musculoskeletal: Degenerative changes of the spine. No acute osseous pathology. Review of the MIP images confirms the above findings. IMPRESSION: 1. No acute intrathoracic, abdominal, or pelvic pathology. No aortic aneurysm or dissection. 2. Emphysema (ICD10-J43.9). Given the presence of pulmonary emphysema, an independent risk factor for lung cancer, consider evaluating the patient for a low-dose CT lung cancer screening program. Electronically Signed   By: Vanetta Chou M.D.   On: 08/26/2024 17:59   CT HEAD CODE STROKE WO CONTRAST Result Date: 08/26/2024 EXAM: CT HEAD WITHOUT CONTRAST 08/26/2024 05:16:07 PM TECHNIQUE: CT of the head was performed  without the administration of intravenous contrast. Automated exposure control, iterative reconstruction, and/or weight based adjustment of the mA/kV was utilized to reduce the radiation dose to as low as reasonably achievable. COMPARISON: None available. CLINICAL HISTORY: Acute neurological deficit, stroke suspected. FINDINGS: BRAIN AND VENTRICLES: No acute hemorrhage. No evidence of acute infarct. No hydrocephalus. No extra-axial collection. No mass effect or midline shift. [place] Stroke Program Early CT (ASPECT) score: Ganglionic (caudate, IC, lentiform nucleus, insula, M1-M3): 7 Supraganglionic (M4-M6): 3 Total: 10 ORBITS: No acute abnormality. SINUSES: No acute abnormality. SOFT TISSUES AND SKULL: Left mastoid effusion. No acute soft tissue abnormality. No skull fracture. VASCULATURE: There is sclerosis of the carotid siphons. IMPRESSION: 1. No acute intracranial abnormality. 2. ASPECTS 10. 3. Findings discussed with Dr. Ernest by telephone at 5:35 PM on 08/26/24. Electronically signed by: Donnice Mania MD 08/26/2024 05:36 PM EST RP Workstation: [id number]    Medications: I have reviewed the patient's current medications. Scheduled:  aspirin   81 mg Oral Daily   cholecalciferol  5,000 Units Oral Daily   clopidogrel   75 mg Oral Daily   enoxaparin  (LOVENOX ) injection  40 mg Subcutaneous Q24H   ezetimibe   10 mg Oral Daily   fluticasone   2 spray Each Nare Daily   furosemide   20 mg Oral Daily   hydrOXYzine   25 mg Oral QID   metoprolol  succinate  25 mg Oral Daily   rosuvastatin   10 mg Oral Daily    Assessment/Plan: 59 year old male with a history of tobacco use, GERD and back injury who presented with complaints of left sided numbness and weakness.  Initially improved in the ED but has worsened overnight. NIHSS of 3.  MRI of the brain personally reviewed and reveals a right internal capsular infarct.  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   On 12/18 A1c 5.7, LDL 110. Echocardiogram is pending  Recommendations:  1. PT consult, OT consult, Speech consult 2. Echocardiogram pending 3. Smoking cessation counseling 4. Agree with dual antiplatelet therapy.  After thee weeks would continue Plavix  at 75mg  daily and discontinue ASA. 5. Agree with Crestor .  Would dose at 20mg  daily 6. Telemetry monitoring if patient will allow 7. Permissive BP management with no treatment of SBP<200 for the first 24-48 hours.  Goal BP after that time <140/80      LOS: 0 days   Sonny Hock, MD Neurology  08/27/2024  9:45 AM

## 2024-08-27 NOTE — Progress Notes (Signed)
" °  Echocardiogram 2D Echocardiogram has been performed.  Andrew Rios 08/27/2024, 11:47 AM "

## 2024-08-27 NOTE — Progress Notes (Signed)
 [facility]  - [place] [facility]   PATIENT NAME: Andrew Rios    MR#:  [id number]  DATE OF BIRTH:  1965-11-01  SUBJECTIVE:      VITALS:  Blood pressure (!) 106/54, pulse (!) 49, temperature 98.2 F (36.8 C), resp. rate 16, height 5' 7 (1.702 m), weight 76.2 kg, SpO2 98%.  PHYSICAL EXAMINATION:   GENERAL:  59 y.o.-year-old patient with no acute distress.  LUNGS: Normal breath sounds bilaterally, no wheezing CARDIOVASCULAR: S1, S2 normal. No murmur   ABDOMEN: Soft, nontender, nondistended. Bowel sounds present.  EXTREMITIES: No  edema b/l.    NEUROLOGIC: nonfocal  patient is alert and awake SKIN: No obvious rash, lesion, or ulcer.   LABORATORY PANEL:  CBC Recent Labs  Lab 08/26/24 1707  WBC 9.6  HGB 13.6  HCT 38.6*  PLT 316    Chemistries  Recent Labs  Lab 08/26/24 1707  NA 140  K 3.9  CL 101  CO2 29  GLUCOSE 100*  BUN 8  CREATININE 0.87  CALCIUM  9.1  AST 20  ALT 28  ALKPHOS 120  BILITOT 0.3   Cardiac Enzymes No results for input(s): TROPONINI in the last 168 hours. RADIOLOGY:  ECHOCARDIOGRAM COMPLETE BUBBLE STUDY Result Date: 08/27/2024    ECHOCARDIOGRAM REPORT   Patient Name:   Bianca SHIHAB [place] Date of Exam: 08/27/2024 Medical Rec #:  [id number]               Height:       67.0 in Accession #:    [id number]              Weight:       168.0 lb Date of Birth:  [birth year]                BSA:          1.878 m Patient Age:    58 years                BP:           127/86 mmHg Patient Gender: M                       HR:           50 bpm. Exam Location:  [facility] Procedure: 2D Echo and Saline Contrast Bubble Study (Both Spectral and Color            Flow Doppler were utilized during procedure). Indications:     stroke  History:         Patient has no prior history of Echocardiogram examinations.                  CAD, Signs/Symptoms:Edema and Chest Pain; Risk                  Factors:Hypertension, Dyslipidemia and Current Smoker.   Sonographer:     Tinnie Barefoot RDCS Referring Phys:  [id number] JAN A MANSY Diagnosing Phys: Dwayne D Callwood MD IMPRESSIONS  1. Left ventricular ejection fraction, by estimation, is 65 to 70%. The left ventricle has normal function. The left ventricle has no regional wall motion abnormalities. Left ventricular diastolic parameters are consistent with Grade II diastolic dysfunction (pseudonormalization).  2. Right ventricular systolic function is low normal. The right ventricular size is mildly enlarged.  3. The mitral valve is normal in structure. Trivial mitral valve regurgitation.  4. The aortic valve is normal in structure. Aortic valve  regurgitation is not visualized. Aortic valve sclerosis is present, with no evidence of aortic valve stenosis.  5. Agitated saline contrast bubble study was negative, with no evidence of any interatrial shunt. FINDINGS  Left Ventricle: Left ventricular ejection fraction, by estimation, is 65 to 70%. The left ventricle has normal function. The left ventricle has no regional wall motion abnormalities. Strain was performed and the global longitudinal strain is indeterminate. The left ventricular internal cavity size was normal in size. There is no left ventricular hypertrophy. Left ventricular diastolic parameters are consistent with Grade II diastolic dysfunction (pseudonormalization). Right Ventricle: The right ventricular size is mildly enlarged. No increase in right ventricular wall thickness. Right ventricular systolic function is low normal. Left Atrium: Left atrial size was normal in size. Right Atrium: Right atrial size was normal in size. Pericardium: There is no evidence of pericardial effusion. Mitral Valve: The mitral valve is normal in structure. Trivial mitral valve regurgitation. Tricuspid Valve: The tricuspid valve is normal in structure. Tricuspid valve regurgitation is trivial. Aortic Valve: The aortic valve is normal in structure. Aortic valve regurgitation is  not visualized. Aortic valve sclerosis is present, with no evidence of aortic valve stenosis. Pulmonic Valve: The pulmonic valve was not well visualized. Pulmonic valve regurgitation is not visualized. Aorta: The ascending aorta was not well visualized. IAS/Shunts: No atrial level shunt detected by color flow Doppler. Agitated saline contrast was given intravenously to evaluate for intracardiac shunting. Agitated saline contrast bubble study was negative, with no evidence of any interatrial shunt. Additional Comments: 3D was performed not requiring image post processing on an independent workstation and was indeterminate.  LEFT VENTRICLE PLAX 2D LVIDd:         4.20 cm     Diastology LVIDs:         2.60 cm     LV e' medial:    11.00 cm/s LV PW:         0.80 cm     LV E/e' medial:  6.6 LV IVS:        0.80 cm     LV e' lateral:   13.10 cm/s LVOT diam:     1.80 cm     LV E/e' lateral: 5.6 LV SV:         50 LV SV Index:   27 LVOT Area:     2.54 cm LV IVRT:       90 msec  LV Volumes (MOD) LV vol d, MOD A4C: 75.2 ml LV vol s, MOD A4C: 29.7 ml LV SV MOD A4C:     75.2 ml RIGHT VENTRICLE          IVC RV Basal diam:  2.50 cm  IVC diam: 1.50 cm TAPSE (M-mode): 2.2 cm                          PULMONARY VEINS                          Diastolic Velocity: 55.40 cm/s                          S/D Velocity:       0.90                          Systolic Velocity:  50.30 cm/s LEFT ATRIUM  Index        RIGHT ATRIUM           Index LA diam:        2.90 cm 1.54 cm/m   RA Area:     11.70 cm LA Vol (A2C):   41.8 ml 22.26 ml/m  RA Volume:   24.40 ml  12.99 ml/m LA Vol (A4C):   26.1 ml 13.90 ml/m LA Biplane Vol: 33.0 ml 17.57 ml/m  AORTIC VALVE LVOT Vmax:   84.40 cm/s LVOT Vmean:  51.400 cm/s LVOT VTI:    0.196 m  AORTA Ao Root diam: 2.70 cm Ao Asc diam:  2.50 cm MITRAL VALVE MV Area (PHT): 2.87 cm    SHUNTS MV Decel Time: 264 msec    Systemic VTI:  0.20 m MV E velocity: 72.80 cm/s  Systemic Diam: 1.80 cm MV A velocity: 49.80  cm/s MV E/A ratio:  1.46 Dwayne D Callwood MD Electronically signed by Cara JONETTA Lovelace MD Signature Date/Time: 08/27/2024/1:12:14 PM    Final    MR BRAIN WO CONTRAST Result Date: 08/26/2024 EXAM: MRI Brain Without Contrast 08/26/2024 11:16:03 PM TECHNIQUE: Multiplanar multisequence MRI of the head/brain was performed without the administration of intravenous contrast. COMPARISON: CT Head 08/26/24 CLINICAL HISTORY: Neuro deficit, acute, stroke suspected FINDINGS: BRAIN AND VENTRICLES: Acute infarct in the posterior limb of the right internal capsule. No intracranial hemorrhage. No mass. No midline shift. No hydrocephalus. The sella is unremarkable. Normal flow voids. ORBITS: No significant abnormality. SINUSES AND MASTOIDS: No significant abnormality. BONES AND SOFT TISSUES: Normal marrow signal. No soft tissue abnormality. IMPRESSION: 1. 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 Electronically signed by: Glendia Molt MD 08/26/2024 11:36 PM EST RP Workstation: [id number]   CT ANGIO HEAD NECK W WO CM (CODE STROKE) Result Date: 08/26/2024 EXAM: CTA HEAD AND NECK WITH AND WITHOUT 08/26/2024 05:47:32 PM TECHNIQUE: CTA of the head and neck was performed with and without the administration of 100 mL of iohexol  (OMNIPAQUE ) 350 MG/ML injection. Multiplanar 2D and/or 3D reformatted images are provided for review. Automated exposure control, iterative reconstruction, and/or weight based adjustment of the mA/kV was utilized to reduce the radiation dose to as low as reasonably achievable. Stenosis of the internal carotid arteries measured using NASCET criteria. COMPARISON: Same day head CT. CLINICAL HISTORY: Neuro deficit, acute, stroke suspected. FINDINGS: CTA NECK: AORTIC ARCH AND ARCH VESSELS: The aortic arch is not included within the field of view. No dissection or arterial injury. No significant stenosis of the brachiocephalic or subclavian arteries. CERVICAL CAROTID ARTERIES: Minimal  atherosclerosis at the right carotid bifurcation without hemodynamically significant stenosis. Minimal atherosclerosis at the left carotid bifurcation without hemodynamically significant stenosis. No dissection or arterial injury. CERVICAL VERTEBRAL ARTERIES: No dissection, arterial injury, or significant stenosis. LUNGS AND MEDIASTINUM: Paraseptal emphysema in the lung apices. Secretions noted in the right posterolateral aspect of the trachea just below the thoracic inlet; recommend correlation for evidence of aspiration. SOFT TISSUES: No acute abnormality. BONES: Mild degenerative changes in the visualized spine. Edentulous maxilla and mandible. CTA HEAD: ANTERIOR CIRCULATION: Mild atherosclerosis of the carotid siphons without hemodynamically significant stenosis. No significant stenosis of the anterior cerebral arteries. No significant stenosis of the middle cerebral arteries. No aneurysm. POSTERIOR CIRCULATION: No significant stenosis of the posterior cerebral arteries. No significant stenosis of the basilar artery. No significant stenosis of the vertebral arteries. No aneurysm. OTHER: No dural venous sinus thrombosis on this non-dedicated study. IMPRESSION: 1. No large vessel occlusion,  hemodynamically significant stenosis, or aneurysm in the head or neck. 2. Secretions in the right posterolateral trachea just below the thoracic inlet. Recommend correlation for symptoms of aspiration. Electronically signed by: Donnice Mania MD 08/26/2024 06:03 PM EST RP Workstation: [id number]   CT Angio Chest/Abd/Pel for Dissection W and/or Wo Contrast Result Date: 08/26/2024 CLINICAL DATA:  Acute aortic syndrome suspected.  Back pain. EXAM: CT ANGIOGRAPHY CHEST, ABDOMEN AND PELVIS TECHNIQUE: Non-contrast CT of the chest was initially obtained. Multidetector CT imaging through the chest, abdomen and pelvis was performed using the standard protocol during bolus administration of intravenous contrast. Multiplanar  reconstructed images and MIPs were obtained and reviewed to evaluate the vascular anatomy. RADIATION DOSE REDUCTION: This exam was performed according to the departmental dose-optimization program which includes automated exposure control, adjustment of the mA and/or kV according to patient size and/or use of iterative reconstruction technique. CONTRAST:  OMNIPAQUE  IOHEXOL  350 MG/ML SOLN COMPARISON:  None Available. FINDINGS: CTA CHEST FINDINGS Cardiovascular: There is no cardiomegaly or pericardial effusion. The thoracic aorta is unremarkable. The origins of the great vessels of the aortic arch appear patent. No pulmonary artery embolus identified. Mediastinum/Nodes: No hilar adenopathy. Top-normal lymph node in the prevascular space measures 10 mm short axis. Subcarinal lymph node measures 11 mm in short axis. The esophagus is grossly unremarkable. No mediastinal fluid collection. Lungs/Pleura: Background of paraseptal emphysema. No focal consolidation, pleural effusion, pneumothorax. The central airways are patent. Musculoskeletal: No acute osseous pathology. Review of the MIP images confirms the above findings. CTA ABDOMEN AND PELVIS FINDINGS VASCULAR Aorta: Mild atherosclerotic calcification. No aneurysmal dilatation or dissection. No periaortic fluid collection. Celiac: Patent without evidence of aneurysm, dissection, vasculitis or significant stenosis. SMA: Patent without evidence of aneurysm, dissection, vasculitis or significant stenosis. Renals: Both renal arteries are patent without evidence of aneurysm, dissection, vasculitis, fibromuscular dysplasia or significant stenosis. IMA: Patent without evidence of aneurysm, dissection, vasculitis or significant stenosis. Inflow: Mild atherosclerotic calcification. No aneurysmal dilatation or dissection. The iliac arteries are patent. Veins: No obvious venous abnormality within the limitations of this arterial phase study. Review of the MIP images confirms  the above findings. NON-VASCULAR No intra-abdominal free air or free fluid. Hepatobiliary: The liver is unremarkable. No biliary ductal dilatation. The gallbladder is unremarkable. Pancreas: Unremarkable. No pancreatic ductal dilatation or surrounding inflammatory changes. Spleen: Normal in size without focal abnormality. Adrenals/Urinary Tract: The adrenal glands unremarkable. There is no hydronephrosis on either side. There is symmetric enhancement and excretion of contrast by both kidneys. The visualized ureters and urinary bladder appear unremarkable. Stomach/Bowel: There is no bowel obstruction or active inflammation. Appendectomy. Lymphatic: No adenopathy. Reproductive: The prostate and seminal vesicles are grossly remarkable Other: Small fat containing left inguinal hernia. Musculoskeletal: Degenerative changes of the spine. No acute osseous pathology. Review of the MIP images confirms the above findings. IMPRESSION: 1. No acute intrathoracic, abdominal, or pelvic pathology. No aortic aneurysm or dissection. 2. Emphysema (ICD10-J43.9). Given the presence of pulmonary emphysema, an independent risk factor for lung cancer, consider evaluating the patient for a low-dose CT lung cancer screening program. Electronically Signed   By: Vanetta Chou M.D.   On: 08/26/2024 17:59   CT HEAD CODE STROKE WO CONTRAST Result Date: 08/26/2024 EXAM: CT HEAD WITHOUT CONTRAST 08/26/2024 05:16:07 PM TECHNIQUE: CT of the head was performed without the administration of intravenous contrast. Automated exposure control, iterative reconstruction, and/or weight based adjustment of the mA/kV was utilized to reduce the radiation dose to as low as reasonably achievable. COMPARISON: None  available. CLINICAL HISTORY: Acute neurological deficit, stroke suspected. FINDINGS: BRAIN AND VENTRICLES: No acute hemorrhage. No evidence of acute infarct. No hydrocephalus. No extra-axial collection. No mass effect or midline shift. [place]  Stroke Program Early CT (ASPECT) score: Ganglionic (caudate, IC, lentiform nucleus, insula, M1-M3): 7 Supraganglionic (M4-M6): 3 Total: 10 ORBITS: No acute abnormality. SINUSES: No acute abnormality. SOFT TISSUES AND SKULL: Left mastoid effusion. No acute soft tissue abnormality. No skull fracture. VASCULATURE: There is sclerosis of the carotid siphons. IMPRESSION: 1. No acute intracranial abnormality. 2. ASPECTS 10. 3. Findings discussed with Dr. Ernest by telephone at 5:35 PM on 08/26/24. Electronically signed by: Donnice Mania MD 08/26/2024 05:36 PM EST RP Workstation: [id number]    Assessment and Plan  Jessup Ogas is a 59 y.o. Caucasian male with medical history significant for GERD and chronic back pain, who presented to the emergency room with acute onset of left-sided weakness and numbness with associated slurred speech that started this afternoon.   Noncontrast head CT scan and a code stroke revealed no acute intracranial normality.    CTA of the head and neck revealed the following: 1. No large vessel occlusion, hemodynamically significant stenosis, or aneurysm in the head or neck. 2. Secretions in the right posterolateral trachea just below the thoracic inlet. Recommend correlation for symptoms of aspiration.   MRI Brain: Acute infarct in the posterior limb of the right internal capsule.    Acute CVA (cerebrovascular accident) (HCC) ischemic infarct in the posterior limb of the right internal capsule  --pt came in with left-sided weakness and numbness as well as slurred speech which are improving. - Neurology consult appreciated with Dr Lindie antiplatelet therapy. After thee weeks would continue Plavix  at 75mg  daily and discontinue ASA.  - statins --PT/OT--CIR--Toc for d/c planning --ST--no needs --echo pending   Dyslipidemia - continue statin therapy and Zetia  and check fasting lipids.   Tobacco abuse -  counseled for smoking cessation   Essential  hypertension - Permissive hypertension will be allowed. - Continue Toprol -XL with permissive parameters.     Procedures: Family communication :mother at bedside Consults : neurology CODE STATUS: full DVT Prophylaxis : Lovenox  Level of care: Telemetry Status is: Inpatient Remains inpatient appropriate because: acute stroke    TOTAL TIME TAKING CARE OF THIS PATIENT: 40 minutes.  >50% time spent on counselling and coordination of care  Note: This dictation was prepared with Dragon dictation along with smaller phrase technology. Any transcriptional errors that result from this process are unintentional.  Leita Blanch M.D    [facility]   CC: Primary care physician; Orlean Alan HERO, FNP

## 2024-08-27 NOTE — Progress Notes (Signed)
 SLP Cancellation Note  Patient Details Name: Andrew Rios MRN: [id number] DOB: 1965-08-24   Cancelled treatment:       Reason Eval/Treat Not Completed: SLP screened, no needs identified, will sign off (chart reviewed; met w/ pt and Mother in room. Consulted MD.)   Pt lying in bed w/ his coat over his head. He did not sit up nor really remove the coat from his head during this visit.  Pt denied any difficulty swallowing and is currently on a regular diet; tolerates swallowing pills w/ water per NSG. Pt conversed briefly w/ this SLP in conversation w/out overt expressive/receptive deficits noted; pt denied any speech-language deficits. Speech intelligible. Mother denied any swallowing issues; she has conversed w/ him to order lunch meal.  No further skilled ST services indicated as pt appears at his baseline. Pt agreed. NSG to reconsult if any change in status while admitted.       Comer Portugal, MS, CCC-SLP Speech Language Pathologist Rehab Services; [facility] [phone] (ascom) Alejah Aristizabal 08/27/2024, 12:56 PM

## 2024-08-27 NOTE — Plan of Care (Signed)

## 2024-08-27 NOTE — Evaluation (Signed)
 Physical Therapy Evaluation Patient Details Name: Andrew Rios MRN: [id number] DOB: Sep 16, 1965 Today's Date: 08/27/2024  History of Present Illness  Pt is a 59 yo male that presented to the ED for L sided weakness and numbness, slurred speech. 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 PMH of back pain, GERD.  Clinical Impression  Patient alert, oriented to self but seemingly frustrated/agitated by current state (mother in room confirmed neurology had just left after sharing information about CVA.) Mother confirmed at baseline he is independent, drives, and they live together. Upon assessment the pt demonstrated deficits in fine/gross motor coordination of LLE, decreased strength (Distal >proximal) in LLE as well. Noted for ability to TKE, but effortful. Pt resistant to education and use of RW, did ambulate with min-modA in room, very reliant on BUE support by staggering/furniture walking. Noted for LLE foot dragging, difficulty with foot placement, and limb advancement.  Overall the patient demonstrated deficits (see PT Problem List) that impede the patient's functional abilities, safety, and mobility and would benefit from skilled PT intervention. Recommend intensive skilled PT to return pt to PLOF and maximize independence.         If plan is discharge home, recommend the following: A lot of help with walking and/or transfers;A lot of help with bathing/dressing/bathroom;Assist for transportation;Assistance with cooking/housework;Direct supervision/assist for medications management;Help with stairs or ramp for entrance   Can travel by private vehicle        Equipment Recommendations Other (comment) (TBD)  Recommendations for Other Services       Functional Status Assessment Patient has had a recent decline in their functional status and demonstrates the ability to make significant improvements in function in a reasonable and predictable  amount of time.     Precautions / Restrictions Precautions Precautions: Fall Restrictions Weight Bearing Restrictions Per Provider Order: No      Mobility  Bed Mobility Overal bed mobility: Needs Assistance Bed Mobility: Supine to Sit     Supine to sit: Supervision, HOB elevated          Transfers Overall transfer level: Needs assistance Equipment used: None Transfers: Sit to/from Stand Sit to Stand: Min assist                Ambulation/Gait Ambulation/Gait assistance: Min assist, Mod assist Gait Distance (Feet): 20 Feet           General Gait Details: reliant on BUE support but refusing/resistant to education and RW. L foot dragging noted, unstable  Stairs            Wheelchair Mobility     Tilt Bed    Modified Rankin (Stroke Patients Only)       Balance Overall balance assessment: Needs assistance Sitting-balance support: Feet supported Sitting balance-Leahy Scale: Fair     Standing balance support: Bilateral upper extremity supported Standing balance-Leahy Scale: Poor                               Pertinent Vitals/Pain Pain Assessment Pain Assessment: Faces Faces Pain Scale: Hurts even more Pain Location: back/hip    Home Living Family/patient expects to be discharged to:: Private residence Living Arrangements: Parent Available Help at Discharge: Family                    Prior Function Prior Level of Function : Independent/Modified Independent;Driving  Extremity/Trunk Assessment   Upper Extremity Assessment Upper Extremity Assessment: Defer to OT evaluation    Lower Extremity Assessment Lower Extremity Assessment: RLE deficits/detail;LLE deficits/detail LLE Deficits / Details: decreased coordination, decreased strength of hip flexion, knee extension, ankle DF/PF, decreasing strength distally to minimal AROM of DF/PF LLE Sensation: WNL LLE Coordination: decreased fine  motor;decreased gross motor       Communication        Cognition Arousal: Alert Behavior During Therapy: WFL for tasks assessed/performed   PT - Cognitive impairments: No apparent impairments                       PT - Cognition Comments: pt oriented to self, but did not answer orientation questions fully. frustrated/impulsive throughout Following commands: Impaired Following commands impaired: Follows one step commands inconsistently     Cueing Cueing Techniques: Verbal cues, Tactile cues, Visual cues     General Comments      Exercises     Assessment/Plan    PT Assessment Patient needs continued PT services  PT Problem List Decreased coordination;Decreased strength;Decreased range of motion;Decreased activity tolerance;Decreased knowledge of use of DME;Decreased safety awareness;Decreased mobility;Decreased knowledge of precautions;Decreased balance       PT Treatment Interventions DME instruction;Balance training;Gait training;Neuromuscular re-education;Stair training;Functional mobility training;Patient/family education;Therapeutic activities;Therapeutic exercise    PT Goals (Current goals can be found in the Care Plan section)  Acute Rehab PT Goals Patient Stated Goal: to go home PT Goal Formulation: With patient Time For Goal Achievement: 09/10/24 Potential to Achieve Goals: Good    Frequency Min 3X/week     Co-evaluation               AM-PAC PT 6 Clicks Mobility  Outcome Measure Help needed turning from your back to your side while in a flat bed without using bedrails?: A Little Help needed moving from lying on your back to sitting on the side of a flat bed without using bedrails?: A Little Help needed moving to and from a bed to a chair (including a wheelchair)?: A Little Help needed standing up from a chair using your arms (e.g., wheelchair or bedside chair)?: A Little Help needed to walk in hospital room?: A Little Help needed  climbing 3-5 steps with a railing? : A Lot 6 Click Score: 17    End of Session   Activity Tolerance: Patient tolerated treatment well Patient left: in bed;with call bell/phone within reach Nurse Communication: Mobility status PT Visit Diagnosis: Difficulty in walking, not elsewhere classified (R26.2);Other abnormalities of gait and mobility (R26.89);Muscle weakness (generalized) (M62.81)    Time: 9064-9046 PT Time Calculation (min) (ACUTE ONLY): 18 min   Charges:   PT Evaluation $PT Eval Low Complexity: 1 Low PT Treatments $Therapeutic Activity: 8-22 mins PT General Charges $$ ACUTE PT VISIT: 1 Visit         Doyal Shams PT, DPT 1:26 PM,08/27/24

## 2024-08-27 NOTE — Assessment & Plan Note (Signed)
-   Permissive hypertension will be allowed. - Continue Toprol -XL with permissive parameters.

## 2024-08-27 NOTE — Evaluation (Signed)
 Occupational Therapy Evaluation Patient Details Name: Andrew Rios MRN: [id number] DOB: 1965/12/26 Today's Date: 08/27/2024   History of Present Illness   Pt is a 59 yo male that presented to the ED for L sided weakness and numbness, slurred speech. 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 PMH of back pain, GERD.     Clinical Impressions Patient presenting with decreased Ind in self care,balance, functional mobility, endurance, and safety awareness. Patient reports being Ind and lives at home with mother. During this session pt is impulsive and shows limited insight to deficits. Bed alarm going off when therapist entered the room and pt reports need to go to bathroom. Pt rams himself into walker and tries to push it away instead of using requiring mod A from therapist to safely assist him with ambulation to bathroom. Manual facilitation for weight shift to take forward step with L LE otherwise pt is dragging LE. Pt stands for toileting with 1 LOB while standing requiring mod A. Pt returning back to bed in same manner as above. Pt's mother present to observe session. OT providing education for stroke risk, OT purpose, and POC with recommendations for follow up services. Pt with significant weakness and coordination deficit in L UE as well. Numbness still reported. Patient will benefit from acute OT to increase overall independence in the areas of ADLs, functional mobility,and safety awareness in order to safely discharge.     If plan is discharge home, recommend the following:   A lot of help with walking and/or transfers;A lot of help with bathing/dressing/bathroom;Assistance with cooking/housework;Assistance with feeding;Assist for transportation;Help with stairs or ramp for entrance     Functional Status Assessment   Patient has had a recent decline in their functional status and demonstrates the ability to make significant improvements  in function in a reasonable and predictable amount of time.     Equipment Recommendations   Other (comment) (defer)     Recommendations for Other Services   Rehab consult     Precautions/Restrictions   Precautions Precautions: Fall     Mobility Bed Mobility Overal bed mobility: Needs Assistance Bed Mobility: Supine to Sit, Sit to Supine     Supine to sit: Supervision, HOB elevated Sit to supine: Supervision, HOB elevated        Transfers Overall transfer level: Needs assistance Equipment used: None Transfers: Sit to/from Stand Sit to Stand: Min assist                  Balance Overall balance assessment: Needs assistance Sitting-balance support: Feet supported Sitting balance-Leahy Scale: Fair     Standing balance support: Bilateral upper extremity supported Standing balance-Leahy Scale: Poor                             ADL either performed or assessed with clinical judgement   ADL Overall ADL's : Needs assistance/impaired     Grooming: Sitting;Moderate assistance;Wash/dry hands                                       Vision Patient Visual Report: No change from baseline       Perception         Praxis         Pertinent Vitals/Pain Pain Assessment Pain Assessment: Faces Faces Pain Scale: Hurts little more Pain  Location: back/hip Pain Descriptors / Indicators: Discomfort Pain Intervention(s): Limited activity within patient's tolerance, Monitored during session     Extremity/Trunk Assessment Upper Extremity Assessment Upper Extremity Assessment: Right hand dominant;LUE deficits/detail LUE Deficits / Details: PROM is WFLs, gross strength 3-/5 LUE Sensation: decreased light touch LUE Coordination: decreased fine motor;decreased gross motor   Lower Extremity Assessment Lower Extremity Assessment: Defer to PT evaluation LLE Deficits / Details: decreased coordination, decreased strength of hip flexion,  knee extension, ankle DF/PF, decreasing strength distally to minimal AROM of DF/PF LLE Sensation: WNL LLE Coordination: decreased fine motor;decreased gross motor          Cognition Arousal: Alert Behavior During Therapy: WFL for tasks assessed/performed Cognition: No apparent impairments                               Following commands: Impaired Following commands impaired: Follows one step commands inconsistently     Cueing  General Comments   Cueing Techniques: Verbal cues;Tactile cues;Visual cues              Home Living Family/patient expects to be discharged to:: Private residence Living Arrangements: Parent Available Help at Discharge: Family Type of Home: House             Bathroom Shower/Tub: Tub/shower unit         Home Equipment: None          Prior Functioning/Environment Prior Level of Function : Independent/Modified Independent;Driving                    OT Problem List: Decreased strength;Impaired balance (sitting and/or standing);Decreased safety awareness;Decreased activity tolerance   OT Treatment/Interventions: Self-care/ADL training;Therapeutic exercise;Patient/family education;Balance training;Energy conservation;Therapeutic activities      OT Goals(Current goals can be found in the care plan section)   Acute Rehab OT Goals Patient Stated Goal: to get better OT Goal Formulation: With patient/family Time For Goal Achievement: 09/10/24 Potential to Achieve Goals: Fair ADL Goals Pt Will Perform Grooming: with supervision;standing Pt Will Perform Lower Body Dressing: with supervision;sit to/from stand Pt Will Transfer to Toilet: with supervision;ambulating Pt Will Perform Toileting - Clothing Manipulation and hygiene: with supervision;sit to/from stand   OT Frequency:  Min 2X/week    Co-evaluation              AM-PAC OT 6 Clicks Daily Activity     Outcome Measure Help from another person eating  meals?: A Little Help from another person taking care of personal grooming?: A Little Help from another person toileting, which includes using toliet, bedpan, or urinal?: A Lot Help from another person bathing (including washing, rinsing, drying)?: A Lot Help from another person to put on and taking off regular upper body clothing?: A Lot Help from another person to put on and taking off regular lower body clothing?: A Lot 6 Click Score: 14   End of Session Equipment Utilized During Treatment: Rolling walker (2 wheels) Nurse Communication: Mobility status  Activity Tolerance: Patient tolerated treatment well Patient left: in bed;with call bell/phone within reach;with bed alarm set;with family/visitor present  OT Visit Diagnosis: Unsteadiness on feet (R26.81);Other abnormalities of gait and mobility (R26.89);Muscle weakness (generalized) (M62.81);Other (comment) (coordination deficit)                Time: 8856-8790 OT Time Calculation (min): 26 min Charges:  OT General Charges $OT Visit: 1 Visit OT Evaluation $OT Eval Moderate Complexity: 1 Mod OT  Treatments $Self Care/Home Management : 8-22 mins  Izetta Claude, MS, OTR/L , CBIS ascom [phone]  08/27/24, 2:29 PM

## 2024-08-28 ENCOUNTER — Other Ambulatory Visit: Payer: Self-pay

## 2024-08-28 DIAGNOSIS — G459 Transient cerebral ischemic attack, unspecified: Secondary | ICD-10-CM | POA: Diagnosis not present

## 2024-08-28 DIAGNOSIS — I6381 Other cerebral infarction due to occlusion or stenosis of small artery: Secondary | ICD-10-CM | POA: Diagnosis not present

## 2024-08-28 DIAGNOSIS — I739 Peripheral vascular disease, unspecified: Secondary | ICD-10-CM | POA: Diagnosis not present

## 2024-08-28 LAB — LIPID PANEL
Cholesterol: 148 mg/dL (ref 0–200)
HDL: 28 mg/dL — ABNORMAL LOW
LDL Cholesterol: 97 mg/dL (ref 0–99)
Total CHOL/HDL Ratio: 5.3 ratio
Triglycerides: 116 mg/dL
VLDL: 23 mg/dL (ref 0–40)

## 2024-08-28 LAB — HEMOGLOBIN A1C
Hgb A1c MFr Bld: 5.7 % — ABNORMAL HIGH (ref 4.8–5.6)
Mean Plasma Glucose: 116.89 mg/dL

## 2024-08-28 MED ORDER — CLOPIDOGREL BISULFATE 75 MG PO TABS
75.0000 mg | ORAL_TABLET | Freq: Every day | ORAL | 3 refills | Status: AC
  Filled 2024-08-28: qty 30, 30d supply, fill #0

## 2024-08-28 MED ORDER — ROSUVASTATIN CALCIUM 20 MG PO TABS
20.0000 mg | ORAL_TABLET | Freq: Every day | ORAL | 1 refills | Status: DC
  Filled 2024-08-28: qty 30, 30d supply, fill #0

## 2024-08-28 MED ORDER — ADULT MULTIVITAMIN W/MINERALS CH
1.0000 | ORAL_TABLET | Freq: Every day | ORAL | 1 refills | Status: DC
  Filled 2024-08-28: qty 30, 30d supply, fill #0

## 2024-08-28 MED ORDER — ASPIRIN 81 MG PO CHEW
81.0000 mg | CHEWABLE_TABLET | Freq: Every day | ORAL | 0 refills | Status: AC
  Filled 2024-08-28: qty 21, 21d supply, fill #0

## 2024-08-28 MED ORDER — ROSUVASTATIN CALCIUM 20 MG PO TABS: 20.0000 mg | ORAL_TABLET | Freq: Every day | ORAL | Status: DC

## 2024-08-28 MED ORDER — NICOTINE 21 MG/24HR TD PT24
21.0000 mg | MEDICATED_PATCH | Freq: Every day | TRANSDERMAL | 0 refills | Status: AC
  Filled 2024-08-28: qty 28, 28d supply, fill #0

## 2024-08-28 NOTE — Progress Notes (Signed)
 Inpatient Rehab Admissions Coordinator:   I spoke with Pt. Regarding potential CIR admit. He states that he will have 24/7 support at home and prefers to dc directly home when medically stable. CIR will sign off and notify TOC.  Leita Kleine, MS, CCC-SLP Rehab Admissions Coordinator  [phone] (celll) [phone] (office)

## 2024-08-28 NOTE — Progress Notes (Signed)
 Subjective: Patient with continued left sided weakness.  No new complaints.    Objective: Current vital signs: BP (!) 132/97 (BP Location: Right Arm)   Pulse 74   Temp 97.9 F (36.6 C) (Oral)   Resp 18   Ht 5' 7 (1.702 m)   Wt 76.2 kg Comment: From D/C Summary on 07/22/24  SpO2 99%   BMI 26.31 kg/m  Vital signs in last 24 hours: Temp:  [97.9 F (36.6 C)-98.8 F (37.1 C)] 97.9 F (36.6 C) (01/24 0352) Pulse Rate:  [49-74] 74 (01/24 0352) Resp:  [16-18] 18 (01/24 0352) BP: (106-132)/(54-97) 132/97 (01/24 0352) SpO2:  [94 %-99 %] 99 % (01/24 0352)  Intake/Output from previous day: 01/23 0701 - 01/24 0700 In: 240 [P.O.:240] Out: -  Intake/Output this shift: No intake/output data recorded. Nutritional status:  Diet Order             Diet regular Fluid consistency: Thin  Diet effective now                   Neurologic Exam: Mental Status: Alert, oriented, thought content appropriate.  Speech fluent without evidence of aphasia.  Able to follow 3 step commands without difficulty. Cranial Nerves: II: Visual fields grossly normal III,IV, VI: ptosis not present, extra-ocular motions intact bilaterally V,VII: mild left facial droop, facial light touch sensation normal bilaterally VIII: hearing normal bilaterally XI: bilateral shoulder shrug XII: midline tongue extension Motor: 5/5 on the right.  4-/5 LUE. LLE 5-/5 Sensory: Pinprick and light touch intact throughout, bilaterally Deep Tendon Reflexes: Symmetric throughout  Lab Results: Basic Metabolic Panel: Recent Labs  Lab 08/26/24 1707  NA 140  K 3.9  CL 101  CO2 29  GLUCOSE 100*  BUN 8  CREATININE 0.87  CALCIUM  9.1    Liver Function Tests: Recent Labs  Lab 08/26/24 1707  AST 20  ALT 28  ALKPHOS 120  BILITOT 0.3  PROT 6.9  ALBUMIN 4.4   No results for input(s): LIPASE, AMYLASE in the last 168 hours. No results for input(s): AMMONIA in the last 168 hours.  CBC: Recent Labs  Lab  08/26/24 1707  WBC 9.6  NEUTROABS 5.9  HGB 13.6  HCT 38.6*  MCV 104.6*  PLT 316    Cardiac Enzymes: No results for input(s): CKTOTAL, CKMB, CKMBINDEX, TROPONINI in the last 168 hours.  Lipid Panel: Recent Labs  Lab 08/27/24 0554 08/28/24 0541  CHOL 156 148  TRIG 106 116  HDL 32* 28*  CHOLHDL 4.8 5.3  VLDL 21 23  LDLCALC 103* 97    CBG: Recent Labs  Lab 08/26/24 1706  GLUCAP 101*    Microbiology: No results found for this or any previous visit.  Coagulation Studies: Recent Labs    08/26/24 1707  LABPROT 13.5  INR 1.0    Imaging: ECHOCARDIOGRAM COMPLETE BUBBLE STUDY Result Date: 08/27/2024    ECHOCARDIOGRAM REPORT   Patient Name:   Carla JAHMARION POPOFF Date of Exam: 08/27/2024 Medical Rec #:  [id number]               Height:       67.0 in Accession #:    [id number]              Weight:       168.0 lb Date of Birth:  [birth year]                BSA:          1.878 m Patient Age:  58 years                BP:           127/86 mmHg Patient Gender: M                       HR:           50 bpm. Exam Location:  [facility] Procedure: 2D Echo and Saline Contrast Bubble Study (Both Spectral and Color            Flow Doppler were utilized during procedure). Indications:     stroke  History:         Patient has no prior history of Echocardiogram examinations.                  CAD, Signs/Symptoms:Edema and Chest Pain; Risk                  Factors:Hypertension, Dyslipidemia and Current Smoker.  Sonographer:     Tinnie Barefoot RDCS Referring Phys:  [id number] JAN A MANSY Diagnosing Phys: Dwayne D Callwood MD IMPRESSIONS  1. Left ventricular ejection fraction, by estimation, is 65 to 70%. The left ventricle has normal function. The left ventricle has no regional wall motion abnormalities. Left ventricular diastolic parameters are consistent with Grade II diastolic dysfunction (pseudonormalization).  2. Right ventricular systolic function is low normal. The right ventricular size is  mildly enlarged.  3. The mitral valve is normal in structure. Trivial mitral valve regurgitation.  4. The aortic valve is normal in structure. Aortic valve regurgitation is not visualized. Aortic valve sclerosis is present, with no evidence of aortic valve stenosis.  5. Agitated saline contrast bubble study was negative, with no evidence of any interatrial shunt. FINDINGS  Left Ventricle: Left ventricular ejection fraction, by estimation, is 65 to 70%. The left ventricle has normal function. The left ventricle has no regional wall motion abnormalities. Strain was performed and the global longitudinal strain is indeterminate. The left ventricular internal cavity size was normal in size. There is no left ventricular hypertrophy. Left ventricular diastolic parameters are consistent with Grade II diastolic dysfunction (pseudonormalization). Right Ventricle: The right ventricular size is mildly enlarged. No increase in right ventricular wall thickness. Right ventricular systolic function is low normal. Left Atrium: Left atrial size was normal in size. Right Atrium: Right atrial size was normal in size. Pericardium: There is no evidence of pericardial effusion. Mitral Valve: The mitral valve is normal in structure. Trivial mitral valve regurgitation. Tricuspid Valve: The tricuspid valve is normal in structure. Tricuspid valve regurgitation is trivial. Aortic Valve: The aortic valve is normal in structure. Aortic valve regurgitation is not visualized. Aortic valve sclerosis is present, with no evidence of aortic valve stenosis. Pulmonic Valve: The pulmonic valve was not well visualized. Pulmonic valve regurgitation is not visualized. Aorta: The ascending aorta was not well visualized. IAS/Shunts: No atrial level shunt detected by color flow Doppler. Agitated saline contrast was given intravenously to evaluate for intracardiac shunting. Agitated saline contrast bubble study was negative, with no evidence of any interatrial  shunt. Additional Comments: 3D was performed not requiring image post processing on an independent workstation and was indeterminate.  LEFT VENTRICLE PLAX 2D LVIDd:         4.20 cm     Diastology LVIDs:         2.60 cm     LV e' medial:    11.00 cm/s LV PW:  0.80 cm     LV E/e' medial:  6.6 LV IVS:        0.80 cm     LV e' lateral:   13.10 cm/s LVOT diam:     1.80 cm     LV E/e' lateral: 5.6 LV SV:         50 LV SV Index:   27 LVOT Area:     2.54 cm LV IVRT:       90 msec  LV Volumes (MOD) LV vol d, MOD A4C: 75.2 ml LV vol s, MOD A4C: 29.7 ml LV SV MOD A4C:     75.2 ml RIGHT VENTRICLE          IVC RV Basal diam:  2.50 cm  IVC diam: 1.50 cm TAPSE (M-mode): 2.2 cm                          PULMONARY VEINS                          Diastolic Velocity: 55.40 cm/s                          S/D Velocity:       0.90                          Systolic Velocity:  50.30 cm/s LEFT ATRIUM             Index        RIGHT ATRIUM           Index LA diam:        2.90 cm 1.54 cm/m   RA Area:     11.70 cm LA Vol (A2C):   41.8 ml 22.26 ml/m  RA Volume:   24.40 ml  12.99 ml/m LA Vol (A4C):   26.1 ml 13.90 ml/m LA Biplane Vol: 33.0 ml 17.57 ml/m  AORTIC VALVE LVOT Vmax:   84.40 cm/s LVOT Vmean:  51.400 cm/s LVOT VTI:    0.196 m  AORTA Ao Root diam: 2.70 cm Ao Asc diam:  2.50 cm MITRAL VALVE MV Area (PHT): 2.87 cm    SHUNTS MV Decel Time: 264 msec    Systemic VTI:  0.20 m MV E velocity: 72.80 cm/s  Systemic Diam: 1.80 cm MV A velocity: 49.80 cm/s MV E/A ratio:  1.46 Dwayne D Callwood MD Electronically signed by Cara JONETTA Lovelace MD Signature Date/Time: 08/27/2024/1:12:14 PM    Final    MR BRAIN WO CONTRAST Result Date: 08/26/2024 EXAM: MRI Brain Without Contrast 08/26/2024 11:16:03 PM TECHNIQUE: Multiplanar multisequence MRI of the head/brain was performed without the administration of intravenous contrast. 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 No intracranial hemorrhage. No mass. No midline shift. No hydrocephalus. The sella is unremarkable. Normal flow voids. ORBITS: No significant abnormality. SINUSES AND MASTOIDS: No significant abnormality. BONES AND SOFT TISSUES: Normal marrow signal. No soft tissue abnormality. IMPRESSION: 1. 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 Electronically signed by: Glendia Molt MD 08/26/2024 11:36 PM EST RP Workstation: [id number]   CT ANGIO HEAD NECK W WO CM (CODE STROKE) Result Date: 08/26/2024 EXAM: CTA HEAD AND NECK WITH AND WITHOUT 08/26/2024 05:47:32 PM TECHNIQUE: CTA  of the head and neck was performed with and without the administration of 100 mL of iohexol  (OMNIPAQUE ) 350 MG/ML injection. Multiplanar 2D and/or 3D reformatted images are provided for review. Automated exposure control, iterative reconstruction, and/or weight based adjustment of the mA/kV was utilized to reduce the radiation dose to as low as reasonably achievable. Stenosis of the internal carotid arteries measured using NASCET criteria. COMPARISON: Same day head CT. CLINICAL HISTORY: Neuro deficit, acute, stroke suspected. FINDINGS: CTA NECK: AORTIC ARCH AND ARCH VESSELS: The aortic arch is not included within the field of view. No dissection or arterial injury. No significant stenosis of the brachiocephalic or subclavian arteries. CERVICAL CAROTID ARTERIES: Minimal atherosclerosis at the right carotid bifurcation without hemodynamically significant stenosis. Minimal atherosclerosis at the left carotid bifurcation without hemodynamically significant stenosis. No dissection or arterial injury. CERVICAL VERTEBRAL ARTERIES: No dissection, arterial injury, or significant stenosis. LUNGS AND MEDIASTINUM: Paraseptal emphysema in the lung apices. Secretions noted in the right posterolateral aspect of the trachea just below the thoracic inlet; recommend correlation  for evidence of aspiration. SOFT TISSUES: No acute abnormality. BONES: Mild degenerative changes in the visualized spine. Edentulous maxilla and mandible. CTA HEAD: ANTERIOR CIRCULATION: Mild atherosclerosis of the carotid siphons without hemodynamically significant stenosis. No significant stenosis of the anterior cerebral arteries. No significant stenosis of the middle cerebral arteries. No aneurysm. POSTERIOR CIRCULATION: No significant stenosis of the posterior cerebral arteries. No significant stenosis of the basilar artery. No significant stenosis of the vertebral arteries. No aneurysm. OTHER: No dural venous sinus thrombosis on this non-dedicated study. IMPRESSION: 1. No large vessel occlusion, hemodynamically significant stenosis, or aneurysm in the head or neck. 2. Secretions in the right posterolateral trachea just below the thoracic inlet. Recommend correlation for symptoms of aspiration. Electronically signed by: Donnice Mania MD 08/26/2024 06:03 PM EST RP Workstation: [id number]   CT Angio Chest/Abd/Pel for Dissection W and/or Wo Contrast Result Date: 08/26/2024 CLINICAL DATA:  Acute aortic syndrome suspected.  Back pain. EXAM: CT ANGIOGRAPHY CHEST, ABDOMEN AND PELVIS TECHNIQUE: Non-contrast CT of the chest was initially obtained. Multidetector CT imaging through the chest, abdomen and pelvis was performed using the standard protocol during bolus administration of intravenous contrast. Multiplanar reconstructed images and MIPs were obtained and reviewed to evaluate the vascular anatomy. RADIATION DOSE REDUCTION: This exam was performed according to the departmental dose-optimization program which includes automated exposure control, adjustment of the mA and/or kV according to patient size and/or use of iterative reconstruction technique. CONTRAST:  OMNIPAQUE  IOHEXOL  350 MG/ML SOLN COMPARISON:  None Available. FINDINGS: CTA CHEST FINDINGS Cardiovascular: There is no cardiomegaly or pericardial  effusion. The thoracic aorta is unremarkable. The origins of the great vessels of the aortic arch appear patent. No pulmonary artery embolus identified. Mediastinum/Nodes: No hilar adenopathy. Top-normal lymph node in the prevascular space measures 10 mm short axis. Subcarinal lymph node measures 11 mm in short axis. The esophagus is grossly unremarkable. No mediastinal fluid collection. Lungs/Pleura: Background of paraseptal emphysema. No focal consolidation, pleural effusion, pneumothorax. The central airways are patent. Musculoskeletal: No acute osseous pathology. Review of the MIP images confirms the above findings. CTA ABDOMEN AND PELVIS FINDINGS VASCULAR Aorta: Mild atherosclerotic calcification. No aneurysmal dilatation or dissection. No periaortic fluid collection. Celiac: Patent without evidence of aneurysm, dissection, vasculitis or significant stenosis. SMA: Patent without evidence of aneurysm, dissection, vasculitis or significant stenosis. Renals: Both renal arteries are patent without evidence of aneurysm, dissection, vasculitis, fibromuscular dysplasia or significant stenosis. IMA: Patent without evidence of aneurysm, dissection, vasculitis  or significant stenosis. Inflow: Mild atherosclerotic calcification. No aneurysmal dilatation or dissection. The iliac arteries are patent. Veins: No obvious venous abnormality within the limitations of this arterial phase study. Review of the MIP images confirms the above findings. NON-VASCULAR No intra-abdominal free air or free fluid. Hepatobiliary: The liver is unremarkable. No biliary ductal dilatation. The gallbladder is unremarkable. Pancreas: Unremarkable. No pancreatic ductal dilatation or surrounding inflammatory changes. Spleen: Normal in size without focal abnormality. Adrenals/Urinary Tract: The adrenal glands unremarkable. There is no hydronephrosis on either side. There is symmetric enhancement and excretion of contrast by both kidneys. The  visualized ureters and urinary bladder appear unremarkable. Stomach/Bowel: There is no bowel obstruction or active inflammation. Appendectomy. Lymphatic: No adenopathy. Reproductive: The prostate and seminal vesicles are grossly remarkable Other: Small fat containing left inguinal hernia. Musculoskeletal: Degenerative changes of the spine. No acute osseous pathology. Review of the MIP images confirms the above findings. IMPRESSION: 1. No acute intrathoracic, abdominal, or pelvic pathology. No aortic aneurysm or dissection. 2. Emphysema (ICD10-J43.9). Given the presence of pulmonary emphysema, an independent risk factor for lung cancer, consider evaluating the patient for a low-dose CT lung cancer screening program. Electronically Signed   By: Vanetta Chou M.D.   On: 08/26/2024 17:59   CT HEAD CODE STROKE WO CONTRAST Result Date: 08/26/2024 EXAM: CT HEAD WITHOUT CONTRAST 08/26/2024 05:16:07 PM TECHNIQUE: CT of the head was performed without the administration of intravenous contrast. Automated exposure control, iterative reconstruction, and/or weight based adjustment of the mA/kV was utilized to reduce the radiation dose to as low as reasonably achievable. COMPARISON: None available. CLINICAL HISTORY: Acute neurological deficit, stroke suspected. FINDINGS: BRAIN AND VENTRICLES: No acute hemorrhage. No evidence of acute infarct. No hydrocephalus. No extra-axial collection. No mass effect or midline shift. [place] Stroke Program Early CT (ASPECT) score: Ganglionic (caudate, IC, lentiform nucleus, insula, M1-M3): 7 Supraganglionic (M4-M6): 3 Total: 10 ORBITS: No acute abnormality. SINUSES: No acute abnormality. SOFT TISSUES AND SKULL: Left mastoid effusion. No acute soft tissue abnormality. No skull fracture. VASCULATURE: There is sclerosis of the carotid siphons. IMPRESSION: 1. No acute intracranial abnormality. 2. ASPECTS 10. 3. Findings discussed with Dr. Ernest by telephone at 5:35 PM on 08/26/24.  Electronically signed by: Donnice Mania MD 08/26/2024 05:36 PM EST RP Workstation: [id number]    Medications: I have reviewed the patient's current medications. Scheduled:  aspirin   81 mg Oral Daily   cholecalciferol   5,000 Units Oral Daily   clopidogrel   75 mg Oral Daily   enoxaparin  (LOVENOX ) injection  40 mg Subcutaneous Q24H   ezetimibe   10 mg Oral Daily   fluticasone   2 spray Each Nare Daily   furosemide   20 mg Oral Daily   hydrOXYzine   25 mg Oral QID   metoprolol  succinate  25 mg Oral Daily   multivitamin with minerals  1 tablet Oral Daily   nicotine   21 mg Transdermal Daily   [START ON 08/29/2024] rosuvastatin   20 mg Oral Daily    Assessment/Plan: 59 year old male with a history of tobacco use, GERD and back injury who presented with complaints of left sided numbness and weakness.  Initially improved in the ED but has worsened overnight. NIHSS of 3.  MRI of the brain personally reviewed and reveals a right internal capsular infarct.  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   On 12/18 A1c 5.7, LDL 110. Echocardiogram shows EF 65-70% with no cardiac source of emboli or evidence of PFO     Recommendations:  1. Agree with dual  antiplatelet therapy.  After thee weeks would continue Plavix  at 75mg  daily and discontinue ASA. 2. Agree with Crestor .  Would dose at 20mg  daily 3. May f/u with neurology on an outpatient basis   LOS: 1 day   Sonny Hock, MD Neurology  08/28/2024  11:16 AM

## 2024-08-28 NOTE — Discharge Summary (Addendum)
 " Physician Discharge Summary   Patient: Andrew Rios MRN: [id number] DOB: 12-10-1965  Admit date:     08/26/2024  Discharge date: 08/28/24  Discharge Physician: Leita Blanch   PCP: Orlean Alan HERO, FNP   Recommendations at discharge:   follow-up PCP in 1 to 2 week patient advised smoking cessation  Discharge Diagnoses: Principal Problem:   TIA (transient ischemic attack) Active Problems:   Acute CVA (cerebrovascular accident) (HCC)   Dyslipidemia   Tobacco abuse   Essential hypertension   Chronic bilateral low back pain with bilateral sciatica   Andrew Rios is a 59 y.o. Caucasian male with medical history significant for GERD and chronic back pain, who presented to the emergency room with acute onset of left-sided weakness and numbness with associated slurred speech that started this afternoon.    Noncontrast head CT scan and a code stroke revealed no acute intracranial normality.     CTA of the head and neck revealed the following: 1. No large vessel occlusion, hemodynamically significant stenosis, or aneurysm in the head or neck. 2. Secretions in the right posterolateral trachea just below the thoracic inlet. Recommend correlation for symptoms of aspiration.   MRI Brain: Acute infarct in the posterior limb of the right internal capsule.      Acute CVA (cerebrovascular accident) (HCC) ischemic infarct in the posterior limb of the right internal capsule  --pt came in with left-sided weakness and numbness as well as slurred speech which are improving. - Neurology consult appreciated with Dr Lindie antiplatelet therapy. After thee weeks would continue Plavix  at 75mg  daily and discontinue ASA.  - statins --PT/OT--CIR--Toc for d/c planning-- patient is declining CIR. He prefers going home with home health. Mother at bedside.. Will arrange home health PT and DME equipment --ST--no needs --echo EF 60--65%   Dyslipidemia - continue statin therapy  and Zetia     Tobacco abuse -  counseled for smoking cessation -- nicotine  patch first   Essential hypertension - Permissive hypertension will be allowed. - Continue Toprol -XL with permissive parameters.  Patient will discharge to home with home health. Pt will need BSC since he is confined to one room that does NOT have toilet due to his neurological deficit from acute CVA.       Procedures: Family communication :mother at bedside Consults : neurology CODE STATUS: full DVT Prophylaxis : Lovenox      Pain control - [place]  Controlled Substance Reporting System database was reviewed. and patient was instructed, not to drive, operate heavy machinery, perform activities at heights, swimming or participation in water activities or provide baby-sitting services while on Pain, Sleep and Anxiety Medications; until their outpatient Physician has advised to do so again. Also recommended to not to take more than prescribed Pain, Sleep and Anxiety Medications.  Disposition: Home health Diet recommendation:  Cardiac diet DISCHARGE MEDICATION: Allergies as of 08/28/2024       Reactions   Gabapentin Other (See Comments)   Made him nauseous and tried Made him nauseous and tried  Made him nauseous and tried        Medication List     STOP taking these medications    diclofenac Sodium 1 % Gel Commonly known as: VOLTAREN   lidocaine  5 % Commonly known as: Lidoderm    Varenicline  Tartrate (Starter) 0.5 MG X 11 & 1 MG X 42 Tbpk Commonly known as: Chantix  Starting Month Pak       TAKE these medications    aspirin  81 MG chewable tablet  Chew 1 tablet (81 mg total) by mouth daily for 21 days.   clopidogrel  75 MG tablet Commonly known as: PLAVIX  Take 1 tablet (75 mg total) by mouth daily. Start taking on: August 29, 2024   ezetimibe  10 MG tablet Commonly known as: ZETIA  TAKE 1 TABLET BY MOUTH ONCE DAILY   fluticasone  50 MCG/ACT nasal spray Commonly known as:  FLONASE  Place 2 sprays into both nostrils daily.   furosemide  20 MG tablet Commonly known as: LASIX  Take 20 mg by mouth daily.   hydrOXYzine  25 MG tablet Commonly known as: ATARAX  Take 1 tablet (25 mg total) by mouth 4 (four) times daily.   metoprolol  succinate 25 MG 24 hr tablet Commonly known as: TOPROL -XL Take 25 mg by mouth daily.   multivitamin with minerals Tabs tablet Take 1 tablet by mouth daily. Start taking on: August 29, 2024   naloxone  4 MG/0.1ML Liqd nasal spray kit Commonly known as: NARCAN  Place 1 spray into the nose as needed (For opioid overdose symptoms.). For overdose symptoms.   nicotine  21 mg/24hr patch Commonly known as: NICODERM CQ  - dosed in mg/24 hours Place 1 patch (21 mg total) onto the skin daily. Start taking on: August 29, 2024   Oxycodone  HCl 10 MG Tabs take 1 tablet by mouth three times daily as needed for severe pain.   rosuvastatin  20 MG tablet Commonly known as: CRESTOR  Take 1 tablet (20 mg total) by mouth daily. Start taking on: August 29, 2024 What changed:  medication strength how much to take Another medication with the same name was removed. Continue taking this medication, and follow the directions you see here.   tiZANidine 2 MG tablet Commonly known as: ZANAFLEX Take 2 mg by mouth at bedtime. What changed:  when to take this reasons to take this   Vitamin D3 125 MCG (5000 UT) Caps Take 1 capsule by mouth daily.               Durable Medical Equipment  (From admission, onward)           Start     Ordered   08/28/24 1118  DME Walker  Once       Question Answer Comment  Walker: With 5 Inch Wheels   Patient needs a walker to treat with the following condition Acute CVA (cerebrovascular accident) (HCC)      08/28/24 1117            Follow-up Information     Orlean Alan HERO, FNP. Schedule an appointment as soon as possible for a visit in 1 week(s).   Specialty: Family Medicine Why: hospital  follow up Contact information: [address] [place] [address] [phone]                Discharge Exam: Andrew Rios   08/26/24 2219  Weight: 76.2 kg   Alert and oriented times three respiratory clear to auscultation cardiovascular both heart sounds normal neuro- left upper and lower extremity weakness. No dysarthria for shoulder  Condition at discharge: fair  The results of significant diagnostics from this hospitalization (including imaging, microbiology, ancillary and laboratory) are listed below for reference.   Imaging Studies: ECHOCARDIOGRAM COMPLETE BUBBLE STUDY Result Date: 08/27/2024    ECHOCARDIOGRAM REPORT   Patient Name:   Andrew Rios Date of Exam: 08/27/2024 Medical Rec #:  [id number]               Height:       67.0 in Accession #:  [id number]              Weight:       168.0 lb Date of Birth:  06/22/1966                BSA:          1.878 m Patient Age:    58 years                BP:           127/86 mmHg Patient Gender: M                       HR:           50 bpm. Exam Location:  [facility] Procedure: 2D Echo and Saline Contrast Bubble Study (Both Spectral and Color            Flow Doppler were utilized during procedure). Indications:     stroke  History:         Patient has no prior history of Echocardiogram examinations.                  CAD, Signs/Symptoms:Edema and Chest Pain; Risk                  Factors:Hypertension, Dyslipidemia and Current Smoker.  Sonographer:     Tinnie Barefoot RDCS Referring Phys:  [id number] JAN A MANSY Diagnosing Phys: Dwayne D Callwood MD IMPRESSIONS  1. Left ventricular ejection fraction, by estimation, is 65 to 70%. The left ventricle has normal function. The left ventricle has no regional wall motion abnormalities. Left ventricular diastolic parameters are consistent with Grade II diastolic dysfunction (pseudonormalization).  2. Right ventricular systolic function is low normal. The right ventricular size is mildly  enlarged.  3. The mitral valve is normal in structure. Trivial mitral valve regurgitation.  4. The aortic valve is normal in structure. Aortic valve regurgitation is not visualized. Aortic valve sclerosis is present, with no evidence of aortic valve stenosis.  5. Agitated saline contrast bubble study was negative, with no evidence of any interatrial shunt. FINDINGS  Left Ventricle: Left ventricular ejection fraction, by estimation, is 65 to 70%. The left ventricle has normal function. The left ventricle has no regional wall motion abnormalities. Strain was performed and the global longitudinal strain is indeterminate. The left ventricular internal cavity size was normal in size. There is no left ventricular hypertrophy. Left ventricular diastolic parameters are consistent with Grade II diastolic dysfunction (pseudonormalization). Right Ventricle: The right ventricular size is mildly enlarged. No increase in right ventricular wall thickness. Right ventricular systolic function is low normal. Left Atrium: Left atrial size was normal in size. Right Atrium: Right atrial size was normal in size. Pericardium: There is no evidence of pericardial effusion. Mitral Valve: The mitral valve is normal in structure. Trivial mitral valve regurgitation. Tricuspid Valve: The tricuspid valve is normal in structure. Tricuspid valve regurgitation is trivial. Aortic Valve: The aortic valve is normal in structure. Aortic valve regurgitation is not visualized. Aortic valve sclerosis is present, with no evidence of aortic valve stenosis. Pulmonic Valve: The pulmonic valve was not well visualized. Pulmonic valve regurgitation is not visualized. Aorta: The ascending aorta was not well visualized. IAS/Shunts: No atrial level shunt detected by color flow Doppler. Agitated saline contrast was given intravenously to evaluate for intracardiac shunting. Agitated saline contrast bubble study was negative, with no evidence of any interatrial shunt.  Additional Comments: 3D was performed not  requiring image post processing on an independent workstation and was indeterminate.  LEFT VENTRICLE PLAX 2D LVIDd:         4.20 cm     Diastology LVIDs:         2.60 cm     LV e' medial:    11.00 cm/s LV PW:         0.80 cm     LV E/e' medial:  6.6 LV IVS:        0.80 cm     LV e' lateral:   13.10 cm/s LVOT diam:     1.80 cm     LV E/e' lateral: 5.6 LV SV:         50 LV SV Index:   27 LVOT Area:     2.54 cm LV IVRT:       90 msec  LV Volumes (MOD) LV vol d, MOD A4C: 75.2 ml LV vol s, MOD A4C: 29.7 ml LV SV MOD A4C:     75.2 ml RIGHT VENTRICLE          IVC RV Basal diam:  2.50 cm  IVC diam: 1.50 cm TAPSE (M-mode): 2.2 cm                          PULMONARY VEINS                          Diastolic Velocity: 55.40 cm/s                          S/D Velocity:       0.90                          Systolic Velocity:  50.30 cm/s LEFT ATRIUM             Index        RIGHT ATRIUM           Index LA diam:        2.90 cm 1.54 cm/m   RA Area:     11.70 cm LA Vol (A2C):   41.8 ml 22.26 ml/m  RA Volume:   24.40 ml  12.99 ml/m LA Vol (A4C):   26.1 ml 13.90 ml/m LA Biplane Vol: 33.0 ml 17.57 ml/m  AORTIC VALVE LVOT Vmax:   84.40 cm/s LVOT Vmean:  51.400 cm/s LVOT VTI:    0.196 m  AORTA Ao Root diam: 2.70 cm Ao Asc diam:  2.50 cm MITRAL VALVE MV Area (PHT): 2.87 cm    SHUNTS MV Decel Time: 264 msec    Systemic VTI:  0.20 m MV E velocity: 72.80 cm/s  Systemic Diam: 1.80 cm MV A velocity: 49.80 cm/s MV E/A ratio:  1.46 Dwayne D Callwood MD Electronically signed by Cara JONETTA Lovelace MD Signature Date/Time: 08/27/2024/1:12:14 PM    Final    MR BRAIN WO CONTRAST Result Date: 08/26/2024 EXAM: MRI Brain Without Contrast 08/26/2024 11:16:03 PM TECHNIQUE: Multiplanar multisequence MRI of the head/brain was performed without the administration of intravenous contrast. COMPARISON: CT Head 08/26/24 CLINICAL HISTORY: Neuro deficit, acute, stroke suspected FINDINGS: BRAIN AND VENTRICLES: Acute  infarct in the posterior limb of the right internal capsule. No intracranial hemorrhage. No mass. No midline shift. No hydrocephalus. The sella is unremarkable. Normal flow voids. ORBITS: No significant abnormality. SINUSES AND MASTOIDS: No significant  abnormality. BONES AND SOFT TISSUES: Normal marrow signal. No soft tissue abnormality. IMPRESSION: 1. 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 Electronically signed by: Glendia Molt MD 08/26/2024 11:36 PM EST RP Workstation: [id number]   CT ANGIO HEAD NECK W WO CM (CODE STROKE) Result Date: 08/26/2024 EXAM: CTA HEAD AND NECK WITH AND WITHOUT 08/26/2024 05:47:32 PM TECHNIQUE: CTA of the head and neck was performed with and without the administration of 100 mL of iohexol  (OMNIPAQUE ) 350 MG/ML injection. Multiplanar 2D and/or 3D reformatted images are provided for review. Automated exposure control, iterative reconstruction, and/or weight based adjustment of the mA/kV was utilized to reduce the radiation dose to as low as reasonably achievable. Stenosis of the internal carotid arteries measured using NASCET criteria. COMPARISON: Same day head CT. CLINICAL HISTORY: Neuro deficit, acute, stroke suspected. FINDINGS: CTA NECK: AORTIC ARCH AND ARCH VESSELS: The aortic arch is not included within the field of view. No dissection or arterial injury. No significant stenosis of the brachiocephalic or subclavian arteries. CERVICAL CAROTID ARTERIES: Minimal atherosclerosis at the right carotid bifurcation without hemodynamically significant stenosis. Minimal atherosclerosis at the left carotid bifurcation without hemodynamically significant stenosis. No dissection or arterial injury. CERVICAL VERTEBRAL ARTERIES: No dissection, arterial injury, or significant stenosis. LUNGS AND MEDIASTINUM: Paraseptal emphysema in the lung apices. Secretions noted in the right posterolateral aspect of the trachea just below the thoracic inlet; recommend correlation for  evidence of aspiration. SOFT TISSUES: No acute abnormality. BONES: Mild degenerative changes in the visualized spine. Edentulous maxilla and mandible. CTA HEAD: ANTERIOR CIRCULATION: Mild atherosclerosis of the carotid siphons without hemodynamically significant stenosis. No significant stenosis of the anterior cerebral arteries. No significant stenosis of the middle cerebral arteries. No aneurysm. POSTERIOR CIRCULATION: No significant stenosis of the posterior cerebral arteries. No significant stenosis of the basilar artery. No significant stenosis of the vertebral arteries. No aneurysm. OTHER: No dural venous sinus thrombosis on this non-dedicated study. IMPRESSION: 1. No large vessel occlusion, hemodynamically significant stenosis, or aneurysm in the head or neck. 2. Secretions in the right posterolateral trachea just below the thoracic inlet. Recommend correlation for symptoms of aspiration. Electronically signed by: Donnice Mania MD 08/26/2024 06:03 PM EST RP Workstation: [id number]   CT Angio Chest/Abd/Pel for Dissection W and/or Wo Contrast Result Date: 08/26/2024 CLINICAL DATA:  Acute aortic syndrome suspected.  Back pain. EXAM: CT ANGIOGRAPHY CHEST, ABDOMEN AND PELVIS TECHNIQUE: Non-contrast CT of the chest was initially obtained. Multidetector CT imaging through the chest, abdomen and pelvis was performed using the standard protocol during bolus administration of intravenous contrast. Multiplanar reconstructed images and MIPs were obtained and reviewed to evaluate the vascular anatomy. RADIATION DOSE REDUCTION: This exam was performed according to the departmental dose-optimization program which includes automated exposure control, adjustment of the mA and/or kV according to patient size and/or use of iterative reconstruction technique. CONTRAST:  OMNIPAQUE  IOHEXOL  350 MG/ML SOLN COMPARISON:  None Available. FINDINGS: CTA CHEST FINDINGS Cardiovascular: There is no cardiomegaly or pericardial  effusion. The thoracic aorta is unremarkable. The origins of the great vessels of the aortic arch appear patent. No pulmonary artery embolus identified. Mediastinum/Nodes: No hilar adenopathy. Top-normal lymph node in the prevascular space measures 10 mm short axis. Subcarinal lymph node measures 11 mm in short axis. The esophagus is grossly unremarkable. No mediastinal fluid collection. Lungs/Pleura: Background of paraseptal emphysema. No focal consolidation, pleural effusion, pneumothorax. The central airways are patent. Musculoskeletal: No acute osseous pathology. Review of the MIP images confirms the above findings. CTA  ABDOMEN AND PELVIS FINDINGS VASCULAR Aorta: Mild atherosclerotic calcification. No aneurysmal dilatation or dissection. No periaortic fluid collection. Celiac: Patent without evidence of aneurysm, dissection, vasculitis or significant stenosis. SMA: Patent without evidence of aneurysm, dissection, vasculitis or significant stenosis. Renals: Both renal arteries are patent without evidence of aneurysm, dissection, vasculitis, fibromuscular dysplasia or significant stenosis. IMA: Patent without evidence of aneurysm, dissection, vasculitis or significant stenosis. Inflow: Mild atherosclerotic calcification. No aneurysmal dilatation or dissection. The iliac arteries are patent. Veins: No obvious venous abnormality within the limitations of this arterial phase study. Review of the MIP images confirms the above findings. NON-VASCULAR No intra-abdominal free air or free fluid. Hepatobiliary: The liver is unremarkable. No biliary ductal dilatation. The gallbladder is unremarkable. Pancreas: Unremarkable. No pancreatic ductal dilatation or surrounding inflammatory changes. Spleen: Normal in size without focal abnormality. Adrenals/Urinary Tract: The adrenal glands unremarkable. There is no hydronephrosis on either side. There is symmetric enhancement and excretion of contrast by both kidneys. The  visualized ureters and urinary bladder appear unremarkable. Stomach/Bowel: There is no bowel obstruction or active inflammation. Appendectomy. Lymphatic: No adenopathy. Reproductive: The prostate and seminal vesicles are grossly remarkable Other: Small fat containing left inguinal hernia. Musculoskeletal: Degenerative changes of the spine. No acute osseous pathology. Review of the MIP images confirms the above findings. IMPRESSION: 1. No acute intrathoracic, abdominal, or pelvic pathology. No aortic aneurysm or dissection. 2. Emphysema (ICD10-J43.9). Given the presence of pulmonary emphysema, an independent risk factor for lung cancer, consider evaluating the patient for a low-dose CT lung cancer screening program. Electronically Signed   By: Vanetta Chou M.D.   On: 08/26/2024 17:59   CT HEAD CODE STROKE WO CONTRAST Result Date: 08/26/2024 EXAM: CT HEAD WITHOUT CONTRAST 08/26/2024 05:16:07 PM TECHNIQUE: CT of the head was performed without the administration of intravenous contrast. Automated exposure control, iterative reconstruction, and/or weight based adjustment of the mA/kV was utilized to reduce the radiation dose to as low as reasonably achievable. COMPARISON: None available. CLINICAL HISTORY: Acute neurological deficit, stroke suspected. FINDINGS: BRAIN AND VENTRICLES: No acute hemorrhage. No evidence of acute infarct. No hydrocephalus. No extra-axial collection. No mass effect or midline shift. [place] Stroke Program Early CT (ASPECT) score: Ganglionic (caudate, IC, lentiform nucleus, insula, M1-M3): 7 Supraganglionic (M4-M6): 3 Total: 10 ORBITS: No acute abnormality. SINUSES: No acute abnormality. SOFT TISSUES AND SKULL: Left mastoid effusion. No acute soft tissue abnormality. No skull fracture. VASCULATURE: There is sclerosis of the carotid siphons. IMPRESSION: 1. No acute intracranial abnormality. 2. ASPECTS 10. 3. Findings discussed with Dr. Ernest by telephone at 5:35 PM on 08/26/24.  Electronically signed by: Donnice Mania MD 08/26/2024 05:36 PM EST RP Workstation: [id number]    Microbiology: No results found for this or any previous visit.  Labs: CBC: Recent Labs  Lab 08/26/24 1707  WBC 9.6  NEUTROABS 5.9  HGB 13.6  HCT 38.6*  MCV 104.6*  PLT 316   Basic Metabolic Panel: Recent Labs  Lab 08/26/24 1707  NA 140  K 3.9  CL 101  CO2 29  GLUCOSE 100*  BUN 8  CREATININE 0.87  CALCIUM  9.1   Liver Function Tests: Recent Labs  Lab 08/26/24 1707  AST 20  ALT 28  ALKPHOS 120  BILITOT 0.3  PROT 6.9  ALBUMIN 4.4   CBG: Recent Labs  Lab 08/26/24 1706  GLUCAP 101*    Discharge time spent: greater than 30 minutes.  Signed: Leita Blanch, MD [facility] 08/28/2024 "

## 2024-08-28 NOTE — Plan of Care (Signed)

## 2024-08-28 NOTE — TOC Transition Note (Signed)
 Transition of Care [employer]) - Discharge Note   Patient Details  Name: Andrew Rios MRN: [id number] Date of Birth: 02/06/1966  Transition of Care [facility]) CM/SW Contact:  Racheal LITTIE Schimke, RN Phone Number: 08/28/2024, 2:08 PM      Final next level of care: Home w Home Health Services Barriers to Discharge: Barriers Resolved   Patient Goals and CMS Choice            Discharge Placement                    Patient and family notified of of transfer: 08/28/24  Discharge Plan and Services Additional resources added to the After Visit Summary for                  DME Arranged: 3-N-1, [place] [facility] Agency: [employer] Date DME Agency Contacted: 08/28/24   Representative spoke with at DME Agency: [facility] Arranged: PT [facility] Agency: [facility] Date [facility] Agency Contacted: 08/28/24   Representative spoke with at [facility] Agency: [facility]  Social Drivers of Health (SDOH) Interventions SDOH Screenings   Food Insecurity: No Food Insecurity (08/26/2024)  Housing: Low Risk (08/26/2024)  Transportation Needs: No Transportation Needs (08/26/2024)  Utilities: Not At Risk (08/26/2024)  Alcohol Screen: Low Risk (07/22/2023)  Depression (PHQ2-9): Low Risk (07/22/2024)  Financial Resource Strain: Low Risk  (03/16/2024)   Received [facility]  Physical Activity: Inactive (07/22/2024)  Social Connections: Unknown (07/22/2024)  Stress: No Stress Concern Present (07/22/2024)  Tobacco Use: High Risk (08/27/2024)  Health Literacy: Adequate Health Literacy (07/22/2024)     Readmission Risk Interventions     No data to display

## 2024-08-28 NOTE — Progress Notes (Signed)
 The patient accidentally pulled off an old scab to hid left lower arm with a small amount of bleeding noted. This clinical research associate cleansed with normal saline and applied a foam dressing.

## 2024-08-28 NOTE — Progress Notes (Signed)
 Mobility Specialist - Progress Note    08/28/24 1215  Mobility  Activity Ambulated with assistance;Stood with assistance  Level of Assistance Maximum assist, patient does 25-49%  Assistive Device None  Distance Ambulated (ft) 20 ft  Range of Motion/Exercises Active  Activity Response Tolerated well  Mobility Referral Yes  Mobility visit 1 Mobility   Pt resting EOB upon entry on RA. Pt is very impulsive initially upon standing and throughout session. Pt STS and ambulates to bathroom requiring Mod/Max assist holding to prevent falls. Pt has +3 LOB and foot crossing due to weakness on left side. Pt also had trouble turning due to walking imbalance on left side and weakness. Pt required to EOB with Mod/Max holding and left with needs in reach. Bed alarm activated.   Guido Rumble Mobility Specialist 08/28/24, 12:30 PM

## 2024-08-28 NOTE — Discharge Instructions (Signed)
 Advise smoking cessation

## 2024-08-28 NOTE — Progress Notes (Signed)
 Physical Therapy Treatment Patient Details Name: Andrew Rios MRN: [id number] DOB: June 04, 1966 Today's Date: 08/28/2024   History of Present Illness Pt is a 59 yo male that presented to the ED for L sided weakness and numbness, slurred speech. 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 PMH of back pain, GERD.    PT Comments  Pt tolerated treatment well today; treatment focused on proximal stability, gait, and functional strength training. Today, he is able to improve assist levels, standing balance, and ambulation distance from previous session. He is mod I for bed mobility, supervision for transfers, and CGA-minA for ambulation.   Pt with multiple LOB during ambulation requiring intermittent periods of minA for correction, as pt does not present with adequate balance reactive strategies. Improved quality of gait, balance, and safety noted with use of QC in RUE, however, pt somewhat resistant to multimodal cues for proper sequencing of gait with QC. Pt and mother educated extensively re: current deficits, neuroplasticity, and safety with ambulation for decreased risk of falls; they verbalized understanding.   Pt will continue to benefit from skilled acute PT services to address deficits for return to baseline function. Will continue per POC.  Encourage OOB mobility with nursing and mobility tech for meals and toileting for continued progress towards goals and maintenance of IND with functional mobility while hospitalized.     If plan is discharge home, recommend the following: A lot of help with walking and/or transfers;A lot of help with bathing/dressing/bathroom;Assist for transportation;Assistance with cooking/housework;Direct supervision/assist for medications management;Help with stairs or ramp for entrance   Can travel by private vehicle      Yes  Equipment Recommendations   (quad cane) 3in1      Precautions / Restrictions  Precautions Precautions: Fall Restrictions Weight Bearing Restrictions Per Provider Order: No Other Position/Activity Restrictions: SpO2 >/= 94%     Mobility  Bed Mobility               General bed mobility comments: mod I for sup<>sit, HOB flat, UE support (R>L)    Transfers                   General transfer comment: supervision for safety to perform multiple STS at EOB without AD and with QC in R hand. demo's poor proximity to bed and eccentric control with stand>sit    Ambulation/Gait   Gait Distance (Feet):  (62ft x2 no AD; 76ft x1 with QC)           General Gait Details: minA for balance to ambulate to/from bathroom without AD, intermittent RUE support on walls/furniture for steadying. discontinuous steps with decreased L foot clearance noted, decreased obstacle negotiation, and impaired balance reactive strategies. improved to mostly CGA with intermittent periods of minA to ambulate with QC in R hand, requiring multimodal cues for safety and sequencing.     Balance Overall balance assessment: Needs assistance Sitting-balance support: Feet supported Sitting balance-Leahy Scale: Good     Standing balance support: Bilateral upper extremity supported Standing balance-Leahy Scale: Fair Standing balance comment: fluctuating assistance; without AD pt is poor and with QC is fair                            Communication    Cognition Arousal: Alert Behavior During Therapy: WFL for tasks assessed/performed   PT - Cognitive impairments: No apparent impairments  Following commands: Impaired Following commands impaired: Follows multi-step commands inconsistently    Cueing Cueing Techniques: Verbal cues, Tactile cues, Visual cues  Exercises Other Exercises Other Exercises: Pt and mother edu re: PT role/POC, DC recs, safety with mobility, fall risk, neuroplasticity and recovery time after infarct, increase use  of LUE with ADL's, sequencing of mobility with QC, call for help, importance of participation with therapy.    General Comments General comments (skin integrity, edema, etc.): continues to be at increased risk of falls      Pertinent Vitals/Pain Pain Assessment Pain Assessment: 0-10 Pain Score: 10-Worst pain ever Pain Descriptors / Indicators: Aching, Constant Pain Intervention(s): Monitored during session, Repositioned     PT Goals (current goals can now be found in the care plan section) Acute Rehab PT Goals Patient Stated Goal: to go home PT Goal Formulation: With patient Time For Goal Achievement: 09/10/24 Potential to Achieve Goals: Good Progress towards PT goals: Progressing toward goals    Frequency    Min 3X/week       AM-PAC PT 6 Clicks Mobility   Outcome Measure  Help needed turning from your back to your side while in a flat bed without using bedrails?: None Help needed moving from lying on your back to sitting on the side of a flat bed without using bedrails?: None Help needed moving to and from a bed to a chair (including a wheelchair)?: A Little Help needed standing up from a chair using your arms (e.g., wheelchair or bedside chair)?: A Little Help needed to walk in hospital room?: A Little Help needed climbing 3-5 steps with a railing? : A Little 6 Click Score: 20    End of Session Equipment Utilized During Treatment: Gait belt Activity Tolerance: Patient tolerated treatment well Patient left: in bed;with call bell/phone within reach;with family/visitor present Nurse Communication: Mobility status PT Visit Diagnosis: Difficulty in walking, not elsewhere classified (R26.2);Other abnormalities of gait and mobility (R26.89);Muscle weakness (generalized) (M62.81)     Time: 0950-1004 PT Time Calculation (min) (ACUTE ONLY): 14 min  Charges:    $Gait Training: 8-22 mins PT General Charges $$ ACUTE PT VISIT: 1 Visit                      Camie CHARLENA Kluver, PT, DPT 11:55 AM,08/28/24 Physical Therapist - [facility]

## 2024-08-30 ENCOUNTER — Telehealth: Payer: Self-pay

## 2024-08-30 ENCOUNTER — Other Ambulatory Visit: Payer: Self-pay

## 2024-08-30 NOTE — Transitions of Care (Post Inpatient/ED Visit) (Signed)
 "  08/30/2024  Name: Andrew Rios MRN: [id number] DOB: 21-Jul-1966  Today's TOC FU Call Status: Today's TOC FU Call Status:: Successful TOC FU Call Completed TOC FU Call Complete Date: 08/30/24  Patient's Name and Date of Birth confirmed. Name, DOB  Transition Care Management Follow-up Telephone Call Date of Discharge: 08/28/24 Discharge Facility: [facility]) Type of Discharge: Inpatient Admission Primary Inpatient Discharge Diagnosis:: CVA How have you been since you were released from the hospital?: Better Any questions or concerns?: No  Items Reviewed: Did you receive and understand the discharge instructions provided?: Yes Medications obtained,verified, and reconciled?: Yes (Medications Reviewed) Any new allergies since your discharge?: No Dietary orders reviewed?: Yes Type of Diet Ordered:: Low Sodium Heart Healthy Do you have support at home?: Yes People in Home [RPT]: parent(s) Name of Support/Comfort Primary Source: Sabastien Tyler  Medications Reviewed Today: Medications Reviewed Today     Reviewed by Moises Reusing, RN (Case Manager) on 08/30/24 at 1208  Med List Status: <None>   Medication Order Taking? Sig Documenting Provider Last Dose Status Informant  aspirin  81 MG chewable tablet [id number]  Chew 1 tablet (81 mg total) by mouth daily for 21 days. Tobie Calix, MD  Active   Cholecalciferol  (VITAMIN D3) 125 MCG (5000 UT) CAPS [id number]  Take 1 capsule by mouth daily. [provider]  Active Self  clopidogrel  (PLAVIX ) 75 MG tablet [id number]  Take 1 tablet (75 mg total) by mouth daily. Tobie Calix, MD  Active   ezetimibe  (ZETIA ) 10 MG tablet [id number]  TAKE 1 TABLET BY MOUTH ONCE DAILY Orlean Alan HERO, FNP  Active Self  fluticasone  (FLONASE ) 50 MCG/ACT nasal spray [id number]  Place 2 sprays into both nostrils daily. [provider]  Active Self  furosemide  (LASIX ) 20 MG tablet [id number]  Take 20 mg by mouth  daily. [provider]  Active Self  hydrOXYzine  (ATARAX ) 25 MG tablet [id number]  Take 1 tablet (25 mg total) by mouth 4 (four) times daily. Orlean Alan HERO, FNP  Active Self  metoprolol  succinate (TOPROL -XL) 25 MG 24 hr tablet [id number]  Take 25 mg by mouth daily. [provider]  Active Self  Multiple Vitamin (MULTIVITAMIN WITH MINERALS) TABS tablet [id number]  Take 1 tablet by mouth daily. Tobie Calix, MD  Active   naloxone  (NARCAN ) nasal spray 4 mg/0.1 mL [id number]  Place 1 spray into the nose as needed (For opioid overdose symptoms.). For overdose symptoms.  Patient not taking: Reported on 08/30/2024   Orlean Alan HERO, FNP  Active Self  nicotine  (NICODERM CQ  - DOSED IN MG/24 HOURS) 21 mg/24hr patch [id number]  Place 1 patch (21 mg total) onto the skin daily.  Patient not taking: Reported on 08/30/2024   Patel, Sona, MD  Active     Discontinued 01/02/21 1140   Oxycodone  HCl 10 MG TABS [id number]  take 1 tablet by mouth three times daily as needed for severe pain. Orlean Alan HERO, FNP  Active Self  rosuvastatin  (CRESTOR ) 20 MG tablet [id number]  Take 1 tablet (20 mg total) by mouth daily. Patel, Sona, MD  Active   tiZANidine (ZANAFLEX) 2 MG tablet [id number]  Take 2 mg by mouth at bedtime.  Patient taking differently: Take 2 mg by mouth as needed.   [provider]  Active Self            Home Care and Equipment/Supplies: Were Home Health Services Ordered?: Yes Name of Home Health Agency:: [facility] Has Agency set up a time  to come to your home?: No EMR reviewed for Home Health Orders: Orders present/patient has not received call (refer to CM for follow-up) Any new equipment or medical supplies ordered?: Yes Name of Medical supply agency?: [facility] Were you able to get the equipment/medical supplies?: Yes Do you have any questions related to the use of the equipment/supplies?: No  Functional Questionnaire: Do you need assistance with bathing/showering or  dressing?: No Do you need assistance with meal preparation?: No Do you need assistance with eating?: No Do you have difficulty maintaining continence: No Do you need assistance with getting out of bed/getting out of a chair/moving?: Yes (The patient states he is taking it slowly as his balance is not good) Do you have difficulty managing or taking your medications?: No  Follow up appointments reviewed: PCP Follow-up appointment confirmed?: Yes Date of PCP follow-up appointment?: 09/06/24 Follow-up Provider: Alan Arrant Specialist [facility] Follow-up appointment confirmed?: Yes Date of Specialist follow-up appointment?: 09/10/24 Follow-Up Specialty Provider:: Cardiology Do you need transportation to your follow-up appointment?: No Do you understand care options if your condition(s) worsen?: Yes-patient verbalized understanding  SDOH Interventions Today    Flowsheet Row Most Recent Value  SDOH Interventions   Food Insecurity Interventions Intervention Not Indicated  Housing Interventions Intervention Not Indicated  Transportation Interventions Intervention Not Indicated  Utilities Interventions Intervention Not Indicated    Goals Addressed             This Visit's Progress    VBCI Transitions of Care (TOC) Care Plan       Problems:  Recent Hospitalization for treatment of CAD, Acute CVA Knowledge Deficit Related to Stroke and long term effects, Medication access barrier Nicotine  patches are not covered by insurance, and Hospital or ED Admin Risk is 86%  Goal:  Over the next 30 days, the patient will not experience hospital readmission  Interventions:   Stroke: Reviewed Importance of taking all medications as prescribed Reviewed Importance of attending all scheduled provider appointments Assessed social determinant of health barriers Assessed for signs and symptoms of stroke Reviewed referrals to home health Assessed for cognitive impairment Assessed for fall status  and safety in the home Assessed use of tobacco use Assessed and advised precautions related to falls Provided Toll free number for Nicotine  patches since patient's insurance will not cover the patches  Patient Self Care Activities:  Attend all scheduled provider appointments Call pharmacy for medication refills 3-7 days in advance of running out of medications Call provider office for new concerns or questions  Notify RN Care Manager of [facility] call rescheduling needs Participate in Transition of Care Program/Attend [facility] scheduled calls Perform all self care activities independently  Take medications as prescribed    Plan:  Telephone follow up appointment with care management team member scheduled for:  February 4th at 11:00am        Medford Balboa, BSN, RN [place]  VBCI - [facility] RN Care Manager [phone]  "

## 2024-08-30 NOTE — Patient Instructions (Signed)
 Visit Information  Thank you for taking time to visit with me today. Please don't hesitate to contact me if I can be of assistance to you before our next scheduled telephone appointment.  Our next appointment is by telephone on Wednesday February 4th at 11:00am  Following is a copy of your care plan:   Goals Addressed             This Visit's Progress    VBCI Transitions of Care (TOC) Care Plan       Problems:  Recent Hospitalization for treatment of CAD, Acute CVA Knowledge Deficit Related to Stroke and long term effects, Medication access barrier Nicotine  patches are not covered by insurance, and Hospital or ED Admin Risk is 86%  Goal:  Over the next 30 days, the patient will not experience hospital readmission  Interventions:   Stroke: Reviewed Importance of taking all medications as prescribed Reviewed Importance of attending all scheduled provider appointments Assessed social determinant of health barriers Assessed for signs and symptoms of stroke Reviewed referrals to home health Assessed for cognitive impairment Assessed for fall status and safety in the home Assessed use of tobacco use Assessed and advised precautions related to falls Provided Toll free number for Nicotine  patches since patient's insurance will not cover the patches  Patient Self Care Activities:  Attend all scheduled provider appointments Call pharmacy for medication refills 3-7 days in advance of running out of medications Call provider office for new concerns or questions  Notify RN Care Manager of TOC call rescheduling needs Participate in Transition of Care Program/Attend TOC scheduled calls Perform all self care activities independently  Take medications as prescribed    Plan:  Telephone follow up appointment with care management team member scheduled for:  February 4th at 11:00am        Patient verbalizes understanding of instructions and care plan provided today and agrees to view  in MyChart. Active MyChart status and patient understanding of how to access instructions and care plan via MyChart confirmed with patient.     The patient has been provided with contact information for the care management team and has been advised to call with any health related questions or concerns.   Please call the care guide team at [phone] if you need to cancel or reschedule your appointment.   Please call the Suicide and Crisis Lifeline: 988 call the [place]  National Suicide Prevention Lifeline: (731)686-6592 or TTY: 434-688-5082 TTY 612 018 3328) to talk to a trained counselor if you are experiencing a Mental Health or Behavioral Health Crisis or need someone to talk to.  Medford Balboa, BSN, RN [place]  VBCI - [facility] RN Care Manager [phone]

## 2024-09-01 ENCOUNTER — Telehealth: Payer: Self-pay

## 2024-09-06 NOTE — Assessment & Plan Note (Signed)
 Checking labs today.  Continue current therapy for lipid control. Will modify as needed based on labwork results.   -CMP w/eGFR -Lipid Panel

## 2024-09-06 NOTE — Assessment & Plan Note (Signed)
 Patient is seen by cardiology, who manage this condition.  He is well controlled with current therapy.   Will defer to them for further changes to plan of care.

## 2024-09-06 NOTE — Assessment & Plan Note (Signed)
 Setting patient up for referral to pain management.  Will defer to them for further treatment changes.  Reassess at follow up.

## 2024-09-06 NOTE — Assessment & Plan Note (Signed)
 Checking labs today.  Will continue supplements as needed.   - Vitamin D  - Vitamin B12

## 2024-09-07 ENCOUNTER — Other Ambulatory Visit: Payer: Self-pay | Admitting: Family

## 2024-09-08 ENCOUNTER — Telehealth: Payer: Self-pay

## 2024-11-22 ENCOUNTER — Ambulatory Visit: Admitting: Family
